# Patient Record
Sex: Female | Born: 1965 | Hispanic: No | Smoking: Never smoker
Health system: Southern US, Community
[De-identification: ages and names within clinical notes are randomized; demographics above are authoritative.]

## PROBLEM LIST (undated history)

## (undated) DIAGNOSIS — K589 Irritable bowel syndrome without diarrhea: Secondary | ICD-10-CM

## (undated) DIAGNOSIS — I10 Essential (primary) hypertension: Secondary | ICD-10-CM

## (undated) DIAGNOSIS — B009 Herpesviral infection, unspecified: Secondary | ICD-10-CM

## (undated) DIAGNOSIS — R51 Headache: Secondary | ICD-10-CM

## (undated) DIAGNOSIS — J45909 Unspecified asthma, uncomplicated: Secondary | ICD-10-CM

## (undated) DIAGNOSIS — J189 Pneumonia, unspecified organism: Secondary | ICD-10-CM

## (undated) DIAGNOSIS — F329 Major depressive disorder, single episode, unspecified: Secondary | ICD-10-CM

## (undated) DIAGNOSIS — T7840XA Allergy, unspecified, initial encounter: Secondary | ICD-10-CM

## (undated) DIAGNOSIS — R519 Headache, unspecified: Secondary | ICD-10-CM

## (undated) DIAGNOSIS — F32A Depression, unspecified: Secondary | ICD-10-CM

## (undated) DIAGNOSIS — K219 Gastro-esophageal reflux disease without esophagitis: Secondary | ICD-10-CM

## (undated) DIAGNOSIS — D649 Anemia, unspecified: Secondary | ICD-10-CM

## (undated) HISTORY — DX: Major depressive disorder, single episode, unspecified: F32.9

## (undated) HISTORY — PX: COLONOSCOPY: SHX174

## (undated) HISTORY — PX: WISDOM TOOTH EXTRACTION: SHX21

## (undated) HISTORY — DX: Essential (primary) hypertension: I10

## (undated) HISTORY — PX: EYE SURGERY: SHX253

## (undated) HISTORY — DX: Allergy, unspecified, initial encounter: T78.40XA

## (undated) HISTORY — DX: Depression, unspecified: F32.A

## (undated) HISTORY — PX: UPPER GI ENDOSCOPY: SHX6162

## (undated) HISTORY — PX: TUBAL LIGATION: SHX77

---

## 2013-03-10 ENCOUNTER — Ambulatory Visit (INDEPENDENT_AMBULATORY_CARE_PROVIDER_SITE_OTHER): Payer: BC Managed Care – PPO | Admitting: Internal Medicine

## 2013-03-10 ENCOUNTER — Ambulatory Visit: Payer: BC Managed Care – PPO

## 2013-03-10 VITALS — BP 140/88 | HR 93 | Temp 98.1°F | Resp 16 | Ht 63.5 in | Wt 157.0 lb

## 2013-03-10 DIAGNOSIS — M7650 Patellar tendinitis, unspecified knee: Secondary | ICD-10-CM

## 2013-03-10 DIAGNOSIS — M25561 Pain in right knee: Secondary | ICD-10-CM

## 2013-03-10 DIAGNOSIS — M25569 Pain in unspecified knee: Secondary | ICD-10-CM

## 2013-03-10 DIAGNOSIS — M7651 Patellar tendinitis, right knee: Secondary | ICD-10-CM

## 2013-03-10 MED ORDER — MELOXICAM 7.5 MG PO TABS: 7.5000 mg | ORAL_TABLET | Freq: Two times a day (BID) | ORAL | Status: DC | PRN

## 2013-03-10 NOTE — Progress Notes (Signed)
Patient ID: Mariah Nelson MRN: [id number], DOB: [birth year], 47 y.o. Date of Encounter: 03/10/2013, 9:15 PM  Primary Physician: No primary provider on file.  Chief Complaint: Right knee pain  HPI: 47 y.o. female with history below presents with right knee pain. Patient was moving from a 3rd floor home to a 3rd floor home on 03/08/13. During this move she did hire movers, but she did a lot of the lifting and moving herself. After this she began to notice the knee began to swell and become sore. Today the knee started to pop. It is sore along the bilateral sides. It is sore for her to go from a seated position to a standing position, but once she is standing she feels like the knee is "okay." She does feel like the knee is weaker and may "give out" on her a times. No pain proximal. Occasional pain distal, to her ankle, no known injury or trauma.    Past Medical History  Diagnosis Date  . Allergy   . Depression   . Hypertension      Home Meds: Prior to Admission medications   Medication Sig Start Date End Date Taking? Authorizing Provider  citalopram (CELEXA) 10 MG tablet Take 10 mg by mouth daily.   Yes Historical Provider, MD  lisinopril-hydrochlorothiazide (PRINZIDE,ZESTORETIC) 10-12.5 MG per tablet Take 1 tablet by mouth daily.   Yes Historical Provider, MD    Allergies:  Allergies  Allergen Reactions  . Sulfur Rash    History   Social History  . Marital Status: Divorced    Spouse Name: N/A    Number of Children: N/A  . Years of Education: N/A   Occupational History  . Social Worker    Social History Main Topics  . Smoking status: Never Smoker   . Smokeless tobacco: Not on file  . Alcohol Use: Yes     Comment: 1/month  . Drug Use: No  . Sexually Active: Not on file   Other Topics Concern  . Not on file   Social History Narrative  . No narrative on file     Review of Systems: Constitutional: negative for chills, fever, or fatigue  Musculoskeletal: see  above  Physical Exam: Blood pressure 140/88, pulse 93, temperature 98.1 F (36.7 C), temperature source Oral, resp. rate 16, height 5' 3.5" (1.613 m), weight 157 lb (71.215 kg), last menstrual period 03/02/2013, SpO2 98.00%., Body mass index is 27.37 kg/(m^2). General: Well developed, well nourished, in no acute distress. Head: Normocephalic, atraumatic, eyes without discharge, sclera non-icteric, nares are without discharge.  Neck: Supple. Full ROM. Lungs: Breathing is unlabored. Heart: Regular rate. Msk:  Strength and tone normal for age. Extremities/Skin: Right knee with mild effusion. No erythema or ecchymosis. Mild TTP along medial and lateral joint lines. No lateral deviation of the patella. No TTP of the patella. FROM. 5/5 strength. Negative varus and valgus stress. Negative Anterior drawer. Negative Lachman's. Negative McMurray's. Distal pulses intact. Left knee unremarkable.  Neuro: Alert and oriented X 3. Moves all extremities spontaneously. Gait is normal. CNII-XII grossly in tact. Psych:  Responds to questions appropriately with a normal affect.   Right knee: [facility] reading (PRIMARY) by  Dr. Merla Riches. No acute fracture.  ASSESSMENT AND PLAN:  47 y.o. female with patellar tendonitis and knee pain -Hinged knee brace -Mobic 7.5 mg 1 po bid prn #60 no RF -Rest -Ice -Recheck if no better   Signed, Eula Listen, PA-C 03/10/2013 9:15 PM  I have reviewed and agree with  documentation. Robert P. Merla Riches, M.D.

## 2014-06-18 ENCOUNTER — Emergency Department (HOSPITAL_COMMUNITY): Payer: 59

## 2014-06-18 ENCOUNTER — Emergency Department (HOSPITAL_COMMUNITY)
Admission: EM | Admit: 2014-06-18 | Discharge: 2014-06-18 | Disposition: A | Discharge status: Home / Self Care | Payer: 59 | Attending: Emergency Medicine | Admitting: Emergency Medicine

## 2014-06-18 ENCOUNTER — Encounter (HOSPITAL_COMMUNITY): Payer: Self-pay | Admitting: Emergency Medicine

## 2014-06-18 DIAGNOSIS — J040 Acute laryngitis: Secondary | ICD-10-CM | POA: Insufficient documentation

## 2014-06-18 DIAGNOSIS — R000 Tachycardia, unspecified: Secondary | ICD-10-CM | POA: Insufficient documentation

## 2014-06-18 DIAGNOSIS — J159 Unspecified bacterial pneumonia: Secondary | ICD-10-CM | POA: Insufficient documentation

## 2014-06-18 DIAGNOSIS — J189 Pneumonia, unspecified organism: Secondary | ICD-10-CM

## 2014-06-18 DIAGNOSIS — I10 Essential (primary) hypertension: Secondary | ICD-10-CM | POA: Insufficient documentation

## 2014-06-18 DIAGNOSIS — Z8659 Personal history of other mental and behavioral disorders: Secondary | ICD-10-CM | POA: Insufficient documentation

## 2014-06-18 DIAGNOSIS — Z79899 Other long term (current) drug therapy: Secondary | ICD-10-CM | POA: Insufficient documentation

## 2014-06-18 LAB — CBC WITH DIFFERENTIAL/PLATELET
Basophils Absolute: 0 10*3/uL (ref 0.0–0.1)
Basophils Relative: 0 % (ref 0–1)
Eosinophils Absolute: 0 10*3/uL (ref 0.0–0.7)
Eosinophils Relative: 0 % (ref 0–5)
HCT: 38.4 % (ref 36.0–46.0)
Hemoglobin: 13.2 g/dL (ref 12.0–15.0)
Lymphocytes Relative: 12 % (ref 12–46)
Lymphs Abs: 1.3 10*3/uL (ref 0.7–4.0)
MCH: 31.2 pg (ref 26.0–34.0)
MCHC: 34.4 g/dL (ref 30.0–36.0)
MCV: 90.8 fL (ref 78.0–100.0)
Monocytes Absolute: 0.1 10*3/uL (ref 0.1–1.0)
Monocytes Relative: 1 % — ABNORMAL LOW (ref 3–12)
Neutro Abs: 9.4 10*3/uL — ABNORMAL HIGH (ref 1.7–7.7)
Neutrophils Relative %: 87 % — ABNORMAL HIGH (ref 43–77)
Platelets: 271 10*3/uL (ref 150–400)
RBC: 4.23 MIL/uL (ref 3.87–5.11)
RDW: 13.2 % (ref 11.5–15.5)
WBC: 10.9 10*3/uL — ABNORMAL HIGH (ref 4.0–10.5)

## 2014-06-18 LAB — BASIC METABOLIC PANEL
Anion gap: 20 — ABNORMAL HIGH (ref 5–15)
BUN: 7 mg/dL (ref 6–23)
CO2: 20 mEq/L (ref 19–32)
Calcium: 9.2 mg/dL (ref 8.4–10.5)
Chloride: 98 mEq/L (ref 96–112)
Creatinine, Ser: 0.76 mg/dL (ref 0.50–1.10)
GFR calc Af Amer: >90 mL/min (ref >90)
GFR calc non Af Amer: >90 mL/min (ref >90)
Glucose, Bld: 171 mg/dL — ABNORMAL HIGH (ref 70–99)
Potassium: 3.4 mEq/L — ABNORMAL LOW (ref 3.7–5.3)
Sodium: 138 mEq/L (ref 137–147)

## 2014-06-18 MED ORDER — GUAIFENESIN-CODEINE 100-10 MG/5ML PO SOLN
10.0000 mL | Freq: Once | ORAL | Status: AC
  Administered 2014-06-18: 10 mL via ORAL
  Filled 2014-06-18: qty 10

## 2014-06-18 MED ORDER — MORPHINE SULFATE 4 MG/ML IJ SOLN
4.0000 mg | Freq: Once | INTRAMUSCULAR | Status: AC
  Administered 2014-06-18: 4 mg via INTRAVENOUS
  Filled 2014-06-18: qty 1

## 2014-06-18 MED ORDER — PREDNISONE 20 MG PO TABS: 40.0000 mg | ORAL_TABLET | Freq: Every day | ORAL | Status: DC

## 2014-06-18 MED ORDER — DEXTROSE 5 % IV SOLN
1.0000 g | Freq: Once | INTRAVENOUS | Status: AC
  Administered 2014-06-18: 1 g via INTRAVENOUS
  Filled 2014-06-18: qty 10

## 2014-06-18 MED ORDER — SODIUM CHLORIDE 0.9 % IV BOLUS (SEPSIS)
1000.0000 mL | Freq: Once | INTRAVENOUS | Status: AC
  Administered 2014-06-18: 1000 mL via INTRAVENOUS

## 2014-06-18 MED ORDER — IOHEXOL 350 MG/ML SOLN
80.0000 mL | Freq: Once | INTRAVENOUS | Status: AC | PRN
  Administered 2014-06-18: 80 mL via INTRAVENOUS

## 2014-06-18 MED ORDER — KETOROLAC TROMETHAMINE 15 MG/ML IJ SOLN
15.0000 mg | Freq: Once | INTRAMUSCULAR | Status: AC
  Administered 2014-06-18: 15 mg via INTRAVENOUS
  Filled 2014-06-18: qty 1

## 2014-06-18 MED ORDER — DEXTROSE 5 % IV SOLN
500.0000 mg | Freq: Once | INTRAVENOUS | Status: AC
  Administered 2014-06-18: 500 mg via INTRAVENOUS
  Filled 2014-06-18: qty 500

## 2014-06-18 MED ORDER — AZITHROMYCIN 250 MG PO TABS: 250.0000 mg | ORAL_TABLET | Freq: Every day | ORAL | Status: DC

## 2014-06-18 MED ORDER — ONDANSETRON HCL 4 MG/2ML IJ SOLN
4.0000 mg | Freq: Once | INTRAMUSCULAR | Status: AC
  Administered 2014-06-18: 4 mg via INTRAVENOUS
  Filled 2014-06-18: qty 2

## 2014-06-18 NOTE — ED Notes (Signed)
Patient ambulated well and without assistance. Pulse Ox level prior to ambulation 97-98% , HR 117. Pulse Oximetry during and at completion of ambulation was 96-97% , HR 118.

## 2014-06-18 NOTE — Discharge Instructions (Signed)
Laryngitis At the top of your windpipe is your voice box. It is the source of your voice. Inside your voice box are 2 bands of muscles called vocal cords. When you breathe, your vocal cords are relaxed and open so that air can get into the lungs. When you decide to say something, these cords come together and vibrate. The sound from these vibrations goes into your throat and comes out through your mouth as sound. Laryngitis is an inflammation of the vocal cords that causes hoarseness, cough, loss of voice, sore throat, and dry throat. Laryngitis can be temporary (acute) or long-term (chronic). Most cases of acute laryngitis improve with time.Chronic laryngitis lasts for more than 3 weeks. CAUSES Laryngitis can often be related to excessive smoking, talking, or yelling, as well as inhalation of toxic fumes and allergies. Acute laryngitis is usually caused by a viral infection, vocal strain, measles or mumps, or bacterial infections. Chronic laryngitis is usually caused by vocal cord strain, vocal cord injury, postnasal drip, growths on the vocal cords, or acid reflux. SYMPTOMS   Cough.  Sore throat.  Dry throat. RISK FACTORS  Respiratory infections.  Exposure to irritating substances, such as cigarette smoke, excessive amounts of alcohol, stomach acids, and workplace chemicals.  Voice trauma, such as vocal cord injury from shouting or speaking too loud. DIAGNOSIS  Your cargiver will perform a physical exam. During the physical exam, your caregiver will examine your throat. The most common sign of laryngitis is hoarseness. Laryngoscopy may be necessary to confirm the diagnosis of this condition. This procedure allows your caregiver to look into the larynx. HOME CARE INSTRUCTIONS  Drink enough fluids to keep your urine clear or pale yellow.  Rest until you no longer have symptoms or as directed by your caregiver.  Breathe in moist air.  Take all medicine as directed by your  caregiver.  Do not smoke.  Talk as little as possible (this includes whispering).  Write on paper instead of talking until your voice is back to normal.  Follow up with your caregiver if your condition has not improved after 10 days. SEEK MEDICAL CARE IF:   You have trouble breathing.  You cough up blood.  You have persistent fever.  You have increasing pain.  You have difficulty swallowing. MAKE SURE YOU:  Understand these instructions.  Will watch your condition.  Will get help right away if you are not doing well or get worse. Document Released: 12/04/2005 Document Revised: 02/26/2012 Document Reviewed: 02/09/2011 [facility] Patient Information 2015 [place], [place]. This information is not intended to replace advice given to you by your health care provider. Make sure you discuss any questions you have with your health care provider.  Pneumonia, Adult Pneumonia is an infection of the lungs. It may be caused by a germ (virus or bacteria). Some types of pneumonia can spread easily from person to person. This can happen when you cough or sneeze. HOME CARE  Only take medicine as told by your doctor.  Take your medicine (antibiotics) as told. Finish it even if you start to feel better.  Do not smoke.  You may use a vaporizer or humidifier in your room. This can help loosen thick spit (mucus).  Sleep so you are almost sitting up (semi-upright). This helps reduce coughing.  Rest. A shot (vaccine) can help prevent pneumonia. Shots are often advised for:  People over 3 years old.  Patients on chemotherapy.  People with long-term (chronic) lung problems.  People with immune system problems.  GET HELP RIGHT AWAY IF:   You are getting worse.  You cannot control your cough, and you are losing sleep.  You cough up blood.  Your pain gets worse, even with medicine.  You have a fever.  Any of your problems are getting worse, not better.  You have shortness of  breath or chest pain. MAKE SURE YOU:   Understand these instructions.  Will watch your condition.  Will get help right away if you are not doing well or get worse. Document Released: 05/22/2008 Document Revised: 02/26/2012 Document Reviewed: 02/24/2011 [facility] Patient Information 2015 [place], [place]. This information is not intended to replace advice given to you by your health care provider. Make sure you discuss any questions you have with your health care provider.

## 2014-06-18 NOTE — ED Notes (Signed)
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 Pt started to have some hoarseness and throat tightness last night. Today while at work pt had sudden onset of SOB diaphoresis. EMS arrived on scene and pt was lying on floor diaphoretic, yellow in color and obvious respiratory distress. O2sat upon arrival 86%ra. MD at office administered 0.3 of Epi, EMS administered 50 of Benadryl, 50 of Zantac, 125 Solumedrol and 4 of Zofran. EMS also administered Saline neb tx which helped some and o2sats increased to 100%. HR-150 BP-168/110 There is some swelling to airway that you can palpate, pt is able to swallow and c/o sore throat. Denies any cp.

## 2014-06-18 NOTE — ED Provider Notes (Signed)
CSN: [id number]     Arrival date & time 06/18/14  1011 History   First MD Initiated Contact with Patient 06/18/14 1017     Chief Complaint  Patient presents with  . Respiratory Distress     (Consider location/radiation/quality/duration/timing/severity/associated sxs/prior Treatment) HPI  47yF with sob. Last night began having sore throat/tightness. Acutely worse shortly before arrival. Diaphoresis. No CP. +SOB. Given epi, steroids, benadryl/pepcid prior to arrival. Still feels SOB. No new exposures that she is aware of. No drooling. No rash/itching. Mild nausea. No vomiting or diarrhea. No abdominal pain/cramping. No fever. No hx of similar symptoms.   Past Medical History  Diagnosis Date  . Allergy   . Depression   . Hypertension    Past Surgical History  Procedure Laterality Date  . Eye surgery    . Tubal ligation     Family History  Problem Relation Age of Onset  . Diabetes Mother   . Hypertension Mother   . Heart disease Father   . Diabetes Father   . Alcohol abuse Father   . Diabetes Sister   . Hypertension Sister    History  Substance Use Topics  . Smoking status: Never Smoker   . Smokeless tobacco: Not on file  . Alcohol Use: Yes     Comment: 1/month   OB History   Grav Para Term Preterm Abortions TAB SAB Ect Mult Living                 Review of Systems  All systems reviewed and negative, other than as noted in HPI.   Allergies  Sulfur  Home Medications   Prior to Admission medications   Medication Sig Start Date End Date Taking? Authorizing Provider  Cholecalciferol (VITAMIN D) 2000 UNITS CAPS Take 2,000 Units by mouth daily.   Yes Historical Provider, MD  ECHINACEA-ZINC-VITAMIN C PO Take 1 tablet by mouth daily as needed (for cold symptoms).   Yes Historical Provider, MD  lisinopril-hydrochlorothiazide (PRINZIDE,ZESTORETIC) 10-12.5 MG per tablet Take 1 tablet by mouth daily.   Yes Historical Provider, MD  loratadine (CLARITIN) 10 MG tablet Take  10 mg by mouth daily.   Yes Historical Provider, MD   BP 138/93  Pulse 137  Temp(Src) 99 F (37.2 C) (Oral)  Resp 30  SpO2 100% Physical Exam  Nursing note and vitals reviewed. Constitutional: She appears well-developed and well-nourished.  Sitting up in bed. Appears uncomfortable.   HENT:  Head: Normocephalic and atraumatic.  Mouth/Throat: Oropharynx is clear and moist.  Muffled sounding voice. Anterior neck seems full but no intraoral swelling appreciated. Posterior pharynx clear. Handling secretions. Neck supple. No adenopathy. Submental tissues soft.   Eyes: Conjunctivae are normal. Right eye exhibits no discharge. Left eye exhibits no discharge.  Neck: Neck supple.  Cardiovascular: Regular rhythm and normal heart sounds.  Exam reveals no gallop and no friction rub.   No murmur heard. tachycardic  Pulmonary/Chest: Breath sounds normal.  Tachypnea. lungs clear.   Abdominal: Soft. She exhibits no distension. There is no tenderness.  Musculoskeletal: She exhibits no edema and no tenderness.  Lower extremities symmetric as compared to each other. No calf tenderness. Negative Homan's. No palpable cords.   Neurological: She is alert.  Skin: Skin is warm and dry.  Psychiatric: Her behavior is normal. Thought content normal.  Appears anxious    ED Course  Procedures (including critical care time) Labs Review Labs Reviewed  CBC WITH DIFFERENTIAL - Abnormal; Notable for the following:    WBC 10.9 (*)  Neutrophils Relative % 87 (*)    Neutro Abs 9.4 (*)    Monocytes Relative 1 (*)    All other components within normal limits  BASIC METABOLIC PANEL    Imaging Review No results found.  Dg Neck Soft Tissue  06/18/2014   CLINICAL DATA:  RESPIRATORY DISTRESS  EXAM: NECK SOFT TISSUES - 1+ VIEW  COMPARISON:  None.  FINDINGS: There is no evidence of retropharyngeal soft tissue swelling or epiglottic enlargement. The cervical airway is unremarkable and no radio-opaque foreign body  identified.  IMPRESSION: Negative.   Electronically Signed   By: Margaree Mackintosh M.D.   On: 06/18/2014 11:59   Dg Chest 2 View  06/18/2014   CLINICAL DATA:  RESPIRATORY DISTRESS  EXAM: CHEST  2 VIEW  COMPARISON:  None.  FINDINGS: The heart size and mediastinal contours are within normal limits. Both lungs are clear. The visualized skeletal structures are unremarkable.  IMPRESSION: No active cardiopulmonary disease.   Electronically Signed   By: Margaree Mackintosh M.D.   On: 06/18/2014 11:58   Ct Soft Tissue Neck W Contrast  06/18/2014   CLINICAL DATA:  RESPIRATORY DISTRESS  EXAM: CT NECK WITH CONTRAST  TECHNIQUE: Multidetector CT imaging of the neck was performed using the standard protocol following the bolus administration of intravenous contrast.  CONTRAST:  51mL OMNIPAQUE IOHEXOL 350 MG/ML SOLN  COMPARISON:  None.  FINDINGS: Skull base a negative. Visualized portions of the orbits are symmetric and negative.  The spaces of the neck are maintained.  The pharyngeal, laryngeal, and glottic regions are unremarkable.  No neck masses, free fluid, nor loculated fluid collections. Airway is patent.  The submandibular and parotid glands are symmetric without enhancement or attenuation abnormalities.  The vascular structures are unremarkable. No pathologic sized adenopathy within the neck.  The osseous structures.  The lung apices are unremarkable.  IMPRESSION: Unremarkable neck CT.   Electronically Signed   By: Margaree Mackintosh M.D.   On: 06/18/2014 13:44   Ct Angio Chest W/cm &/or Wo Cm  06/18/2014   CLINICAL DATA:  Respiratory distress  EXAM: CT ANGIOGRAPHY CHEST WITH CONTRAST  TECHNIQUE: Multidetector CT imaging of the chest was performed using the standard protocol during bolus administration of intravenous contrast. Multiplanar CT image reconstructions and MIPs were obtained to evaluate the vascular anatomy.  CONTRAST:  81mL OMNIPAQUE IOHEXOL 350 MG/ML SOLN  COMPARISON:  None.  FINDINGS: There are no filling defects  in the pulmonary arterial tree to suggest acute pulmonary thromboembolism.  No abnormal mediastinal adenopathy.  Discoid opacity in the right middle lobe measuring 6 x 9 x 2 mm is likely atelectasis in the right middle lobe on image 44.  Low lung volumes with hypoaeration changes and deep and an lungs.  No pneumothorax or pleural effusion.  No acute bony deformity.  Review of the MIP images confirms the above findings.  IMPRESSION: No evidence of acute pulmonary thromboembolism.  6 x 9 x 2 mm opacity in the right middle lobe is nonspecific but likely linear atelectasis. Initial follow-up by chest CT without contrast is recommended in 3 months to confirm persistence. This recommendation follows the consensus statement: Recommendations for the Management of Subsolid Pulmonary Nodules Detected at CT: A Statement from the [employer] as published in Radiology 2013; 266:304-317.   Electronically Signed   By: Maryclare Bean M.D.   On: 06/18/2014 13:54    EKG Interpretation   Date/Time:  Thursday June 18 2014 10:19:58 EDT Ventricular Rate:  114 PR Interval:  131 QRS Duration: 78 QT Interval:  372 QTC Calculation: 512 R Axis:   28 Text Interpretation:  Age not entered, assumed to be  48 years old for  purpose of ECG interpretation Sinus tachycardia with irregular rate  Borderline repolarization abnormality Prolonged QT interval ED PHYSICIAN  INTERPRETATION AVAILABLE IN [place] [place] Confirmed by TEST, Record  ([id number]) on 06/20/2014 2:23:44 PM      MDM   Final diagnoses:  Laryngitis  CAP (community acquired pneumonia)    52yF with likely laryngitis. Imaging w/o evidence of deep space neck infection. RML opacity. Not convinced that pneumonia but with symptoms, will tx as such. Triage O2 sats low. Unsure of significance. Appears comfortable on DC. Ambulated w/o assistance on RA and O2 sats 97-98%. HR now in 90-100. Initial HR likely from epinephrine PTA. She may have had allergic reaction to  something? Symptoms stabilized w/o further epi given. CT w/o evidence of PE. I feel stable for discharge and outpt tx.     Virgel Manifold, MD 06/21/14 [phone]

## 2014-12-23 ENCOUNTER — Ambulatory Visit (INDEPENDENT_AMBULATORY_CARE_PROVIDER_SITE_OTHER): Payer: 59 | Admitting: Physician Assistant

## 2014-12-23 ENCOUNTER — Encounter: Payer: Self-pay | Admitting: Physician Assistant

## 2014-12-23 VITALS — BP 128/80 | HR 102 | Temp 98.0°F | Resp 17 | Ht 64.0 in | Wt 165.0 lb

## 2014-12-23 DIAGNOSIS — I10 Essential (primary) hypertension: Secondary | ICD-10-CM | POA: Insufficient documentation

## 2014-12-23 DIAGNOSIS — J0110 Acute frontal sinusitis, unspecified: Secondary | ICD-10-CM

## 2014-12-23 MED ORDER — AMOXICILLIN-POT CLAVULANATE 875-125 MG PO TABS: 1.0000 | ORAL_TABLET | Freq: Two times a day (BID) | ORAL | Status: DC

## 2014-12-23 MED ORDER — IPRATROPIUM BROMIDE 0.03 % NA SOLN: 2.0000 | Freq: Two times a day (BID) | NASAL | Status: DC

## 2014-12-23 NOTE — Progress Notes (Signed)
Subjective:    Patient ID: Mariah Nelson, female    DOB: [birth year], 49 y.o.   MRN: [id number]  HPI Patient presents for sinus infection that has been present for 2 weeks. Has also noticed left lymph node swelling over the past few days. Feels T-zone sinus pressure and pressure behind the eyes. Endorses congestion, rhinorrhea, mild sore throat, and headache. Has had intermittent mild cough. Decided to come in today due to new sx of dizziness. Denies fever, ear pressure, SOB/CP, or difficulty swallowing. 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 Has tried Mucinex, other OTC decongestants, and Advil with some relief. Has seasonal allergies that are controlled, but no h/o asthma and has never smoked. Med allergy/intolerance to sulfa drugs and tessalon.    Review of Systems  Constitutional: Positive for fatigue. Negative for fever, chills, diaphoresis, activity change and appetite change.  HENT: Positive for congestion, postnasal drip, rhinorrhea, sinus pressure and sore throat. Negative for ear discharge, ear pain, nosebleeds, sneezing and trouble swallowing.   Eyes: Positive for pain (pressure). Negative for discharge and visual disturbance.  Respiratory: Positive for cough. Negative for chest tightness, shortness of breath and wheezing.   Cardiovascular: Negative for chest pain.  Gastrointestinal: Negative for nausea, vomiting and abdominal pain.  Musculoskeletal: Positive for neck pain (left sided). Negative for neck stiffness.  Allergic/Immunologic: Positive for environmental allergies. Negative for food allergies.  Neurological: Positive for headaches. Negative for dizziness and light-headedness.       Objective:   Physical Exam  Constitutional: She is oriented to person, place, and time. She appears well-developed and well-nourished. No distress.  Blood pressure 128/80, pulse 102, temperature 98 F (36.7 C), temperature source Oral, resp. rate  17, height 5\' 4"  (1.626 m), weight 165 lb (74.844 kg), SpO2 96 %.  HENT:  Head: Normocephalic and atraumatic.  Right Ear: Tympanic membrane, external ear and ear canal normal.  Left Ear: Tympanic membrane, external ear and ear canal normal.  Nose: Mucosal edema and rhinorrhea (with erythema) present. Right sinus exhibits frontal sinus tenderness. Right sinus exhibits no maxillary sinus tenderness. Left sinus exhibits frontal sinus tenderness. Left sinus exhibits no maxillary sinus tenderness.  Mouth/Throat: Uvula is midline, oropharynx is clear and moist and mucous membranes are normal. No oropharyngeal exudate.  Eyes: Conjunctivae are normal. Pupils are equal, round, and reactive to light. Right eye exhibits no discharge. Left eye exhibits no discharge. No scleral icterus.  Neck: Normal range of motion. No thyromegaly present.  Cardiovascular: Normal rate, regular rhythm and normal heart sounds.  Exam reveals no gallop and no friction rub.   No murmur heard. Pulmonary/Chest: Effort normal and breath sounds normal. No respiratory distress. She has no decreased breath sounds. She has no wheezes. She has no rhonchi. She has no rales. She exhibits no tenderness.  Abdominal: Soft. Bowel sounds are normal. She exhibits no distension. There is no tenderness.  Lymphadenopathy:    She has cervical adenopathy.       Right cervical: Superficial cervical adenopathy present.       Left cervical: Superficial cervical adenopathy present.  Neurological: She is alert and oriented to person, place, and time.  Skin: Skin is warm and dry. No rash noted. She is not diaphoretic. No erythema. No pallor.       Assessment & Plan:  1. Acute frontal sinusitis, recurrence not specified Plenty of fluid and water.  - ipratropium (ATROVENT) 0.03 % nasal spray; Place 2 sprays into both nostrils  2 (two) times daily.  Dispense: 30 mL; Refill: 0 - amoxicillin-clavulanate (AUGMENTIN) 875-125 MG per tablet; Take 1 tablet by  mouth 2 (two) times daily.  Dispense: 20 tablet; Refill: 0   Jacek Colson PA-C  [facility] 12/23/2014 3:06 PM

## 2014-12-23 NOTE — Patient Instructions (Signed)

## 2014-12-24 ENCOUNTER — Other Ambulatory Visit: Payer: Self-pay

## 2014-12-24 MED ORDER — FLUCONAZOLE 150 MG PO TABS: 150.0000 mg | ORAL_TABLET | Freq: Once | ORAL | Status: DC

## 2014-12-24 NOTE — Telephone Encounter (Signed)
Pt given abx yesterday. Pended Diflucan.

## 2014-12-24 NOTE — Telephone Encounter (Signed)
Pt called. States she needs antibiotic for a yeast infection. [facility] on [address] # [phone]

## 2015-03-02 ENCOUNTER — Encounter: Payer: Self-pay | Admitting: Obstetrics & Gynecology

## 2015-03-02 ENCOUNTER — Ambulatory Visit (INDEPENDENT_AMBULATORY_CARE_PROVIDER_SITE_OTHER): Payer: 59 | Admitting: Obstetrics & Gynecology

## 2015-03-02 VITALS — BP 147/99 | HR 91 | Ht 64.0 in | Wt 161.6 lb

## 2015-03-02 DIAGNOSIS — N920 Excessive and frequent menstruation with regular cycle: Secondary | ICD-10-CM

## 2015-03-02 DIAGNOSIS — Z0000 Encounter for general adult medical examination without abnormal findings: Secondary | ICD-10-CM

## 2015-03-02 DIAGNOSIS — D259 Leiomyoma of uterus, unspecified: Secondary | ICD-10-CM

## 2015-03-02 LAB — CBC
HEMATOCRIT: 34.1 % — AB (ref 36.0–46.0)
Hemoglobin: 10.6 g/dL — ABNORMAL LOW (ref 12.0–15.0)
MCH: 29.5 pg (ref 26.0–34.0)
MCHC: 31.1 g/dL (ref 30.0–36.0)
MCV: 95 fL (ref 78.0–100.0)
MPV: 10.4 fL (ref 8.6–12.4)
Platelets: 423 10*3/uL — ABNORMAL HIGH (ref 150–400)
RBC: 3.59 MIL/uL — ABNORMAL LOW (ref 3.87–5.11)
RDW: 14.4 % (ref 11.5–15.5)
WBC: 3.9 10*3/uL — ABNORMAL LOW (ref 4.0–10.5)

## 2015-03-02 LAB — TSH: TSH: 2.75 u[IU]/mL (ref 0.350–4.500)

## 2015-03-02 NOTE — Progress Notes (Signed)
   Subjective:    Patient ID: Mariah Nelson, female    DOB: 1966-05-05, 49 y.o.   MRN: [id number]  HPI 49 yo S P1 (50 yo son in [place]) here today because of heavy periods with clots.Has to leave work ([employer])  sometimes due to the bleeding. Lasts for 7 days, painful with first few days of periods, IBU helps. She has been on norethindrone 10 mg per day for about a year (Dr. Everett Graff). No u/s done. She had a EMBX last year (negative) and was scheduled to have an ablation versus IUD versus hysterectomy.  She required a d&c as a teenager due to heavy bleeding. Her periods have always been heavy. She was told she has fibroids in the past.   Review of Systems All normal paps, most recent about a year ago.  She is not currently sexually active, but does not recall dyspareunia    Objective:   Physical Exam [place] pale AA lady Breathing and ambulating normally Normal external genitalia, vagina, and cervix Moderate amount of blood seen in vault Enlarged uterus with fibroids, a posterior tender fibroid also noted      Assessment & Plan:  Menorrhagia, fibroids- check TSH, CBC, and u/s

## 2015-03-02 NOTE — Progress Notes (Signed)
Patient is having abnormal bleeding for 5 weeks now.  This is a second opinion.  She did have a normal endometrial biopsy last year but an ultrasound has not been done.  The bleeding has been significant enough to go through her clothes and onto the floor.  She is requesting FMLA to help her because she has to leave work sometimes to change clothes.  She is not interested in ablation or the IUD.  She has done research and is not interested in these options.  She feels increased fatigue and change in her color.

## 2015-03-09 ENCOUNTER — Telehealth: Payer: Self-pay | Admitting: *Deleted

## 2015-03-09 NOTE — Telephone Encounter (Signed)
Patient called to get her ultrasound results, patient notified of results and she will call back to schedule a follow-up with doctor to discuss treatment options.

## 2015-03-18 ENCOUNTER — Ambulatory Visit (INDEPENDENT_AMBULATORY_CARE_PROVIDER_SITE_OTHER): Payer: 59 | Admitting: Obstetrics & Gynecology

## 2015-03-18 ENCOUNTER — Encounter: Payer: Self-pay | Admitting: Obstetrics & Gynecology

## 2015-03-18 VITALS — BP 140/86 | HR 91 | Resp 16 | Ht 64.0 in | Wt 159.0 lb

## 2015-03-18 DIAGNOSIS — D251 Intramural leiomyoma of uterus: Secondary | ICD-10-CM | POA: Diagnosis not present

## 2015-03-18 DIAGNOSIS — N920 Excessive and frequent menstruation with regular cycle: Secondary | ICD-10-CM | POA: Diagnosis not present

## 2015-03-18 DIAGNOSIS — D649 Anemia, unspecified: Secondary | ICD-10-CM | POA: Insufficient documentation

## 2015-03-18 DIAGNOSIS — D500 Iron deficiency anemia secondary to blood loss (chronic): Secondary | ICD-10-CM | POA: Diagnosis not present

## 2015-03-18 LAB — POCT URINE PREGNANCY: PREG TEST UR: NEGATIVE

## 2015-03-18 NOTE — Progress Notes (Signed)
Imaging Results [place] Endovaginal (Non-OB) - Final result (03/02/2015 1:00 PM) [place] Endovaginal (Non-OB) - Final result (03/02/2015 1:00 PM)  Narrative  EXAM: [place] ENDOVAGINAL (NON-OB)  DATE: 03/02/15 13:00:14  ACCESSION: [id number]  DICTATED: 03/02/15 15:38:32  INTERPRETATION LOCATION: [place]    CLINICAL INDICATION: 49 Year Old (F): ABNORMAL UTERTINE BLEEDING.    COMPARISON: None    TECHNIQUE:Ultrasound views of the pelvis were obtained endovaginally using gray scale and color Doppler imaging.    FINDINGS:  The uterus measured 12.3 x 8.5 x 10.4 cm in size and is very heterogenous secondary to multiple fibroids. The endometrium was not well visualized secondary to be multiple uterine fibroids.  The largest uterine fibroid is an anterior mid to fundal intramural fibroid 6.8 x 5.2 x 4.4 cm. Small several subserosal fibroids are also seen measuring up to 3.1 cm.     The ovaries were only seen transabdominally. The right ovary measured 3.8 x 2.0 x 1.5 cm and the left ovary measured 2.9 x 2.5 x 3.0 cm. Small cystic areas were seen within both ovaries compatible with follicles.Appropriate flow was difficult to obtain but is seen in both ovaries with color and spectral doppler.No abnormal pelvic fluid was seen.    IMPRESSION:  The uterus is enlarged by multiple intramural uterine fibroids.The largest fibroid is a 6.8 cm anterior intramural fibroid. MRI could be more helpful for further evaluation.    There fibroids also obscure visualization of the endometrium.

## 2015-03-18 NOTE — Addendum Note (Signed)
Addended by: Lin Landsman C on: 03/18/2015 11:28 AM   Modules accepted: Orders

## 2015-03-18 NOTE — Progress Notes (Signed)
   Subjective:    Patient ID: Mariah Nelson, female    DOB: 10-05-1966, 49 y.o.   MRN: [id number]  HPI  49 yo AA lady with menorrhagia is here to discuss her results/work up. Her hbg was 10.7, normal TSH. Her u/s showed multiple fibroids.  Review of Systems     Objective:   Physical Exam  I did a bimanual exam and that uterus would not be amenable to a vaginal hysterectomy      Assessment & Plan:  Fibroids, anemia, menorrhagia- She will  a referral to IR for possible [place].

## 2015-03-24 ENCOUNTER — Telehealth: Payer: Self-pay | Admitting: *Deleted

## 2015-03-24 DIAGNOSIS — D259 Leiomyoma of uterus, unspecified: Secondary | ICD-10-CM

## 2015-03-26 NOTE — Telephone Encounter (Signed)
Pt needed an order put in.

## 2015-03-30 ENCOUNTER — Ambulatory Visit
Admission: RE | Admit: 2015-03-30 | Discharge: 2015-03-30 | Disposition: A | Discharge status: Home / Self Care | Payer: 59 | Source: Ambulatory Visit | Attending: Obstetrics & Gynecology | Admitting: Obstetrics & Gynecology

## 2015-03-30 DIAGNOSIS — D259 Leiomyoma of uterus, unspecified: Secondary | ICD-10-CM | POA: Insufficient documentation

## 2015-03-30 NOTE — Consult Note (Signed)
Chief Complaint: Chief Complaint  Patient presents with  . Advice Only    Consult for [place] for symptomatic fibroids    Referring Physician(s): Dove,Myra C  History of Present Illness: Mariah Nelson is a 49 y.o. G4 P1 is a 60 female with a long history of symptomatic uterine fibroids. She reports she was diagnosed with fibroids many years ago. Over the last several years she has noticed her periods have become increasingly heavy. Her periods have always been irregular throughout her life. When her cycle comes, it tends to last for 7 days with very heavy bleeding including passage of clots the first 4 days. She often wears overnight pads and has to change them every hour. She does note intermittent interpeak. Bleeding. She has had prior endometrial biopsy in August 2014 which was benign.  She rates her symptoms as quite severe. Her uterine fibroid symptom severity score is 100 out of 100. Additionally, her symptoms significantly affect her quality of life. Her health-related quality of life score is 13 out of a possible 100 (lower numbers indicating lower quality of life).  She has had anemia in the past with a hemoglobin low of 10.7. She took over-the-counter iron which resolved. She has since stopped taking the iron due to constipation. Bulk related symptoms include urinary frequency, bloating and back pain. She's had no prior therapy for fibroids.  Of note, her last menstrual period was 01/24/2015. She has not had a cycle over the last 6 weeks.  She denies fever, chills, vaginal discharge, chest pain, shortness of breath or other systemic symptoms.  She does note night sweats which occur almost every night over the past several years.  Past Medical History  Diagnosis Date  . Allergy   . Depression   . Hypertension     Past Surgical History  Procedure Laterality Date  . Eye surgery    . Tubal ligation      Allergies: Clarinex; Latex; Tessalon; and Sulfa  antibiotics  Medications: Prior to Admission medications   Medication Sig Start Date End Date Taking? Authorizing Provider  amLODipine (NORVASC) 5 MG tablet  11/29/14  Yes Historical Provider, MD  hydrochlorothiazide (MICROZIDE) 12.5 MG capsule  03/17/15  Yes Historical Provider, MD  ibuprofen (ADVIL,MOTRIN) 200 MG tablet Take 200 mg by mouth every 6 (six) hours as needed for moderate pain (Takes 600 mg as needed for back pain/menstrual cramps).   Yes Historical Provider, MD  temazepam (RESTORIL) 30 MG capsule  03/17/15  Yes Historical Provider, MD     Family History  Problem Relation Age of Onset  . Diabetes Mother   . Hypertension Mother   . Heart disease Father   . Diabetes Father   . Alcohol abuse Father   . Diabetes Sister   . Hypertension Sister     History   Social History  . Marital Status: Divorced    Spouse Name: N/A  . Number of Children: N/A  . Years of Education: N/A   Occupational History  . Social Worker    Social History Main Topics  . Smoking status: Never Smoker   . Smokeless tobacco: Never Used  . Alcohol Use: 0.0 oz/week    0 Standard drinks or equivalent per week     Comment: 1/month  . Drug Use: No  . Sexual Activity: Not Currently   Other Topics Concern  . Not on file   Social History Narrative    Review of Systems: A 12 point ROS discussed and pertinent  positives are indicated in the HPI above.  All other systems are negative.  Review of Systems  Vital Signs: BP 142/96 mmHg  Pulse 113  Temp(Src) 98.3 F (36.8 C) (Oral)  Resp 15  Ht 5\' 4"  (1.626 m)  Wt 156 lb (70.761 kg)  BMI 26.76 kg/m2  SpO2 96%  LMP 01/24/2015  Physical Exam  Constitutional: She is oriented to person, place, and time. She appears well-developed and well-nourished.  HENT:  Head: Normocephalic and atraumatic.  Eyes: No scleral icterus.  Neck: No thyromegaly present.  Cardiovascular: Regular rhythm.   Pulmonary/Chest: Effort normal and breath sounds normal.  No respiratory distress.  Abdominal: Soft. She exhibits no distension and no mass. There is no tenderness.  Neurological: She is alert and oriented to person, place, and time.  Skin: Skin is warm and dry.  Psychiatric: She has a normal mood and affect. Her behavior is normal.  Vitals reviewed.   Imaging: No results found.  Labs:  CBC:  Recent Labs  06/18/14 1134 03/02/15 1135  WBC 10.9* 3.9*  HGB 13.2 10.6*  HCT 38.4 34.1*  PLT 271 423*    COAGS: No results for input(s): INR, APTT in the last 8760 hours.  BMP:  Recent Labs  06/18/14 1134  NA 138  K 3.4*  CL 98  CO2 20  GLUCOSE 171*  BUN 7  CALCIUM 9.2  CREATININE 0.76  GFRNONAA >90  GFRAA >90    LIVER FUNCTION TESTS: No results for input(s): BILITOT, AST, ALT, ALKPHOS, PROT, ALBUMIN in the last 8760 hours.  TUMOR MARKERS: No results for input(s): AFPTM, CEA, CA199, CHROMGRNA in the last 8760 hours.  Assessment and Plan:  49 year old female with symptomatic uterine fibroids. Her primary symptom is menometrorrhagia with some relatively mild bulk symptoms. Of note, she has not had a cycle for the past 6 weeks and she has been experiencing night sweats over the last several years. It is very possible given her age that she is perimenopausal.  We discussed the natural history of uterine fibroids as well as the possible therapeutic options including hormonal regulation, endometrial ablation, uterine artery embolization, and surgical hysterectomy. In more detail, we discussed the risks, benefits and alternatives to uterine artery embolization.  After answering all of her questions I encouraged her to consider a wait and see approach. As she has not had a cycle for 6 weeks and may be entering menopause, she may have very few cycles left to deal with before her symptoms resolve on their own. If, however her symptoms and cycle recur I would be more than happy to consider her for uterine artery embolization. The next  step would be an MRI of the pelvis with and without contrast to fully evaluate her uterine anatomy and assess for other concomitant issues such as adenomyosis.  She will call the office if and when she decides to pursue the MRI.  Thank you for this interesting consult.  I greatly enjoyed meeting Chrishawn Kring and look forward to participating in their care.  SignedJacqulynn Cadet 03/30/2015, 5:35 PM   I spent a total of  30 Minutes  in face to face in clinical consultation, greater than 50% of which was counseling/coordinating care for uterine fibroids

## 2015-04-13 ENCOUNTER — Encounter: Payer: Self-pay | Admitting: Obstetrics & Gynecology

## 2015-04-13 ENCOUNTER — Ambulatory Visit (INDEPENDENT_AMBULATORY_CARE_PROVIDER_SITE_OTHER): Payer: 59 | Admitting: Obstetrics & Gynecology

## 2015-04-13 VITALS — BP 143/94 | HR 108 | Wt 161.0 lb

## 2015-04-13 DIAGNOSIS — D251 Intramural leiomyoma of uterus: Secondary | ICD-10-CM

## 2015-04-13 MED ORDER — MEGESTROL ACETATE 40 MG PO TABS: 40.0000 mg | ORAL_TABLET | Freq: Two times a day (BID) | ORAL | Status: DC

## 2015-04-13 NOTE — Progress Notes (Signed)
   Subjective:    Patient ID: Mariah Nelson, female    DOB: 1966/12/17, 49 y.o.   MRN: [id number]  HPI  49 yo lady with menorrhagia, fibroids. She spoke with the IR MD but she prefers to have a hysterectomy. She has bled for the last 6 weeks but finally stopped. She can't have her surgery until Sept because she cannot sign up for short term disabiltiy until August. She still does not want to use a depo provera shot in the interim.  Review of Systems     Objective:   Physical Exam        Assessment & Plan:  I will send Mariah Nelson an email to schedule this.

## 2015-07-14 ENCOUNTER — Encounter (HOSPITAL_COMMUNITY): Payer: Self-pay | Admitting: *Deleted

## 2015-09-07 ENCOUNTER — Encounter (HOSPITAL_COMMUNITY): Admission: RE | Payer: Self-pay | Source: Ambulatory Visit

## 2015-09-07 ENCOUNTER — Inpatient Hospital Stay (HOSPITAL_COMMUNITY): Admission: RE | Admit: 2015-09-07 | Payer: 59 | Source: Ambulatory Visit | Admitting: Obstetrics & Gynecology

## 2015-09-07 SURGERY — HYSTERECTOMY, ABDOMINAL
Anesthesia: Choice | Site: Abdomen

## 2016-03-07 ENCOUNTER — Encounter: Payer: Self-pay | Admitting: Obstetrics & Gynecology

## 2016-03-07 ENCOUNTER — Ambulatory Visit (INDEPENDENT_AMBULATORY_CARE_PROVIDER_SITE_OTHER): Payer: Managed Care, Other (non HMO) | Admitting: Obstetrics & Gynecology

## 2016-03-07 VITALS — BP 188/130 | HR 114 | Resp 16 | Ht 64.0 in | Wt 150.0 lb

## 2016-03-07 DIAGNOSIS — Z862 Personal history of diseases of the blood and blood-forming organs and certain disorders involving the immune mechanism: Secondary | ICD-10-CM | POA: Diagnosis not present

## 2016-03-07 DIAGNOSIS — N898 Other specified noninflammatory disorders of vagina: Secondary | ICD-10-CM

## 2016-03-07 DIAGNOSIS — N9489 Other specified conditions associated with female genital organs and menstrual cycle: Secondary | ICD-10-CM

## 2016-03-07 MED ORDER — METRONIDAZOLE 500 MG PO TABS: 500.0000 mg | ORAL_TABLET | Freq: Two times a day (BID) | ORAL | Status: DC

## 2016-03-07 NOTE — Progress Notes (Signed)
   Subjective:    Patient ID: Mariah Nelson, female    DOB: September 30, 1966, 50 y.o.   MRN: [id number]  HPI  50 yo S AA lady with a long h/o BV here with an intense vaginal odor at times, sometimes this is even noticed by coworkers. Of note, her fibroid pain has improved. She has monthly regular periods, somewhat heavy  Review of Systems     Objective:   Physical Exam WNWHBFNAD Breathing, conversing, and ambulating normally Abd- benign Vagina- no odor or discharge today       Assessment & Plan:  H/o anemia- recheck CBC Recurrent BV- treat with flagyl (refills given). Recommend probiotics.  Wet prep sent

## 2016-03-08 LAB — CBC
HEMATOCRIT: 39.9 % (ref 36.0–46.0)
HEMOGLOBIN: 13.6 g/dL (ref 12.0–15.0)
MCH: 31.6 pg (ref 26.0–34.0)
MCHC: 34.1 g/dL (ref 30.0–36.0)
MCV: 92.8 fL (ref 78.0–100.0)
MPV: 11.1 fL (ref 8.6–12.4)
Platelets: 266 10*3/uL (ref 150–400)
RBC: 4.3 MIL/uL (ref 3.87–5.11)
RDW: 15.2 % (ref 11.5–15.5)
WBC: 4.2 10*3/uL (ref 4.0–10.5)

## 2016-03-08 LAB — WET PREP BY MOLECULAR PROBE
CANDIDA SPECIES: NEGATIVE
Gardnerella vaginalis: POSITIVE — AB
Trichomonas vaginosis: NEGATIVE

## 2016-04-17 ENCOUNTER — Telehealth: Payer: Self-pay | Admitting: *Deleted

## 2016-04-17 DIAGNOSIS — B3731 Acute candidiasis of vulva and vagina: Secondary | ICD-10-CM

## 2016-04-17 DIAGNOSIS — B373 Candidiasis of vulva and vagina: Secondary | ICD-10-CM

## 2016-04-17 MED ORDER — FLUCONAZOLE 150 MG PO TABS: ORAL_TABLET | ORAL | Status: DC

## 2016-04-17 NOTE — Telephone Encounter (Signed)
-----   Message from Francia Greaves sent at 04/17/2016  2:27 PM EDT ----- Regarding: Rx Request Contact: [phone] Called and left a message on office voice mail  States that she had a Rx for BV, and now has a yeast infection, has treated it OTC w/ Monistat but it is unresolved, wants to know if she can have something called in

## 2016-04-17 NOTE — Telephone Encounter (Signed)
Will send in Diflucan with 1 refill

## 2017-05-03 ENCOUNTER — Encounter: Payer: Self-pay | Admitting: Obstetrics & Gynecology

## 2017-05-03 ENCOUNTER — Ambulatory Visit (INDEPENDENT_AMBULATORY_CARE_PROVIDER_SITE_OTHER): Payer: BLUE CROSS/BLUE SHIELD | Admitting: Obstetrics & Gynecology

## 2017-05-03 VITALS — BP 164/90 | HR 86

## 2017-05-03 DIAGNOSIS — Z01419 Encounter for gynecological examination (general) (routine) without abnormal findings: Secondary | ICD-10-CM | POA: Diagnosis not present

## 2017-05-03 DIAGNOSIS — Z1151 Encounter for screening for human papillomavirus (HPV): Secondary | ICD-10-CM

## 2017-05-03 DIAGNOSIS — B373 Candidiasis of vulva and vagina: Secondary | ICD-10-CM

## 2017-05-03 DIAGNOSIS — Z113 Encounter for screening for infections with a predominantly sexual mode of transmission: Secondary | ICD-10-CM

## 2017-05-03 DIAGNOSIS — N938 Other specified abnormal uterine and vaginal bleeding: Secondary | ICD-10-CM

## 2017-05-03 DIAGNOSIS — Z124 Encounter for screening for malignant neoplasm of cervix: Secondary | ICD-10-CM

## 2017-05-03 DIAGNOSIS — B3731 Acute candidiasis of vulva and vagina: Secondary | ICD-10-CM

## 2017-05-03 MED ORDER — MISOPROSTOL 200 MCG PO TABS: ORAL_TABLET | ORAL | 0 refills | Status: AC

## 2017-05-03 MED ORDER — FLUCONAZOLE 150 MG PO TABS: ORAL_TABLET | ORAL | 6 refills | Status: AC

## 2017-05-03 MED ORDER — METRONIDAZOLE 500 MG PO TABS: 500.0000 mg | ORAL_TABLET | Freq: Two times a day (BID) | ORAL | 12 refills | Status: AC

## 2017-05-03 MED ORDER — ALPRAZOLAM 0.25 MG PO TABS: ORAL_TABLET | ORAL | 0 refills | Status: AC

## 2017-05-03 NOTE — Progress Notes (Signed)
Pt would like a year supply of Metronidazole and Diflucan due to recurrent BV after cycles.

## 2017-05-03 NOTE — Progress Notes (Signed)
Subjective:    Mariah Nelson is a 51 y.o. S P1 (36 yo son, no grands) female who presents for an annual exam. The patient has no complaints today. The patient is sexually active. GYN screening history: last pap: was normal. The patient wears seatbelts: yes. The patient participates in regular exercise: yes. Has the patient ever been transfused or tattooed?: no. The patient reports that there is not domestic violence in her life.   Menstrual History: OB History    Gravida Para Term Preterm AB Living   4 1 1  0 3 1   SAB TAB Ectopic Multiple Live Births   3 0 0 0 1      Menarche age: 16 Patient's last menstrual period was 04/12/2017.    The following portions of the patient's history were reviewed and updated as appropriate: allergies, current medications, past family history, past medical history, past social history, past surgical history and problem list.  Review of Systems Pertinent items are noted in HPI.   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/colon cancer Has fibroids, periods are 5-7 days, twice per month   Objective:    BP (!) 164/90   Pulse 86   LMP 04/12/2017   General Appearance:    Alert, cooperative, no distress, appears stated age  Head:    Normocephalic, without obvious abnormality, atraumatic  Eyes:    PERRL, conjunctiva/corneas clear, EOM's intact, fundi    benign, both eyes  Ears:    Normal TM's and external ear canals, both ears  Nose:   Nares normal, septum midline, mucosa normal, no drainage    or sinus tenderness  Throat:   Lips, mucosa, and tongue normal; teeth and gums normal  Neck:   Supple, symmetrical, trachea midline, no adenopathy;    thyroid:  no enlargement/tenderness/nodules; no carotid   bruit or JVD  Back:     Symmetric, no curvature, ROM normal, no CVA tenderness  Lungs:     Clear to auscultation bilaterally, respirations unlabored  Chest Wall:    No tenderness or deformity   Heart:    Regular rate and rhythm, S1 and S2  normal, no murmur, rub   or gallop  Breast Exam:    No tenderness, masses, or nipple abnormality  Abdomen:     Soft, non-tender, bowel sounds active all four quadrants,    no masses, no organomegaly  Genitalia:    Normal female without lesion, discharge or tenderness, 16 week size uterus, mobile, NT     Extremities:   Extremities normal, atraumatic, no cyanosis or edema  Pulses:   2+ and symmetric all extremities  Skin:   Skin color, texture, turgor normal, no rashes or lesions  Lymph nodes:   Cervical, supraclavicular, and axillary nodes normal  Neurologic:   CNII-XII intact, normal strength, sensation and reflexes    throughout  .    Assessment:    Healthy female exam.   Recurrent BV DUB/heavy periods/fibroids   Plan:     Thin prep Pap smear. with cotesting Refills of flagyl and diflucan Rec probiotics and boric acid suppositories prn Mammogram ordered CBC, another gyn u/s Pretreat with cytotec, xanax, IBU

## 2017-05-04 LAB — CBC
HEMOGLOBIN: 12.6 g/dL (ref 11.1–15.9)
Hematocrit: 37.4 % (ref 34.0–46.6)
MCH: 31.2 pg (ref 26.6–33.0)
MCHC: 33.7 g/dL (ref 31.5–35.7)
MCV: 93 fL (ref 79–97)
PLATELETS: 243 10*3/uL (ref 150–379)
RBC: 4.04 x10E6/uL (ref 3.77–5.28)
RDW: 14.7 % (ref 12.3–15.4)
WBC: 3.4 10*3/uL (ref 3.4–10.8)

## 2017-05-04 LAB — CYTOLOGY - PAP
CHLAMYDIA, DNA PROBE: NEGATIVE
Diagnosis: NEGATIVE
HPV: NOT DETECTED
NEISSERIA GONORRHEA: NEGATIVE

## 2017-05-08 ENCOUNTER — Ambulatory Visit (HOSPITAL_COMMUNITY): Payer: BLUE CROSS/BLUE SHIELD

## 2017-05-10 ENCOUNTER — Ambulatory Visit (HOSPITAL_COMMUNITY)
Admission: RE | Admit: 2017-05-10 | Discharge: 2017-05-10 | Disposition: A | Discharge status: Home / Self Care | Payer: BLUE CROSS/BLUE SHIELD | Source: Ambulatory Visit | Attending: Obstetrics & Gynecology | Admitting: Obstetrics & Gynecology

## 2017-05-10 DIAGNOSIS — N938 Other specified abnormal uterine and vaginal bleeding: Secondary | ICD-10-CM | POA: Insufficient documentation

## 2017-05-10 DIAGNOSIS — N852 Hypertrophy of uterus: Secondary | ICD-10-CM | POA: Insufficient documentation

## 2017-05-24 ENCOUNTER — Ambulatory Visit (INDEPENDENT_AMBULATORY_CARE_PROVIDER_SITE_OTHER): Payer: BLUE CROSS/BLUE SHIELD | Admitting: Obstetrics & Gynecology

## 2017-05-24 ENCOUNTER — Encounter: Payer: Self-pay | Admitting: Obstetrics & Gynecology

## 2017-05-24 ENCOUNTER — Other Ambulatory Visit (HOSPITAL_COMMUNITY)
Admission: RE | Admit: 2017-05-24 | Discharge: 2017-05-24 | Disposition: A | Discharge status: Home / Self Care | Payer: BLUE CROSS/BLUE SHIELD | Source: Ambulatory Visit | Attending: Obstetrics & Gynecology | Admitting: Obstetrics & Gynecology

## 2017-05-24 VITALS — BP 126/78 | HR 84 | Ht 64.0 in | Wt 142.4 lb

## 2017-05-24 DIAGNOSIS — N938 Other specified abnormal uterine and vaginal bleeding: Secondary | ICD-10-CM | POA: Insufficient documentation

## 2017-05-24 NOTE — Progress Notes (Signed)
   Subjective:    Patient ID: Mariah Nelson, female    DOB: 01-21-1966, 51 y.o.   MRN: [id number]  HPI  51 yo MP1 here her [facility] for DUB. Her u/s showed several fibroids, the largest was 7x6x5 cm.  Review of Systems Her child was delivered via NSVD.    Objective:   Physical Exam WNWHFNAD Breathing, conversing, and ambulating normally  UPT negative, consent signed, time out done Cervix prepped with betadine and grasped with a single tooth tenaculum Uterus sounded to 9 cm Pipelle used for 1 pass with a large amount of tissue obtained. She tolerated the procedure well.      Assessment & Plan:  DUB- await pathology Consider TVH with SPINAL anesthesia

## 2017-05-25 ENCOUNTER — Ambulatory Visit: Payer: BLUE CROSS/BLUE SHIELD

## 2017-05-31 ENCOUNTER — Encounter: Payer: Self-pay | Admitting: Obstetrics & Gynecology

## 2017-05-31 ENCOUNTER — Ambulatory Visit (INDEPENDENT_AMBULATORY_CARE_PROVIDER_SITE_OTHER): Payer: BLUE CROSS/BLUE SHIELD | Admitting: Obstetrics & Gynecology

## 2017-05-31 VITALS — BP 147/93 | HR 88 | Ht 64.0 in | Wt 145.0 lb

## 2017-05-31 DIAGNOSIS — D259 Leiomyoma of uterus, unspecified: Secondary | ICD-10-CM

## 2017-05-31 DIAGNOSIS — N938 Other specified abnormal uterine and vaginal bleeding: Secondary | ICD-10-CM

## 2017-05-31 NOTE — Progress Notes (Signed)
Last note - DUB- await pathology Consider TVH with SPINAL anesthesia

## 2017-05-31 NOTE — Progress Notes (Signed)
   Subjective:    Patient ID: Mariah Nelson, female    DOB: 07-May-1966, 51 y.o.   MRN: [id number]  HPI 51 yo S P1 here to discuss surgery for her DUB, fibroids. For the last year she has been having hot flashes and night sweats.  Review of Systems She has had a BTL.    Objective:   Physical Exam WNWHFNAD Breathing, conversing, and ambulating normally 16 week size uterus        Assessment & Plan:  DUB, fibroids- she would like TAH/BSO (uterus too big and bulky for [facility]). She understands that she will be fully postmenopausal and may want ERT. She would like a spinal. Does NOT want to go to sleep/general anesthesia.

## 2017-06-15 ENCOUNTER — Ambulatory Visit: Payer: BLUE CROSS/BLUE SHIELD | Admitting: Obstetrics & Gynecology

## 2017-07-02 ENCOUNTER — Encounter (HOSPITAL_COMMUNITY): Payer: Self-pay

## 2017-08-21 ENCOUNTER — Encounter (HOSPITAL_COMMUNITY): Admission: RE | Payer: Self-pay | Source: Ambulatory Visit

## 2017-08-21 ENCOUNTER — Inpatient Hospital Stay (HOSPITAL_COMMUNITY)
Admission: RE | Admit: 2017-08-21 | Payer: BLUE CROSS/BLUE SHIELD | Source: Ambulatory Visit | Admitting: Obstetrics & Gynecology

## 2017-08-21 SURGERY — HYSTERECTOMY, ABDOMINAL
Anesthesia: Choice | Laterality: Bilateral

## 2017-08-27 ENCOUNTER — Encounter (HOSPITAL_COMMUNITY): Payer: Self-pay

## 2017-10-17 IMAGING — US US TRANSVAGINAL NON-OB
1 series · 15 of 25 positions shown · non-contrast
Comparison: None

CLINICAL DATA: Dysfunctional uterine bleeding, frequent periods
every 2 weeks, heavy and painful.



[Series 1: us transvaginal non-ob · 84 acquisitions, 15 frames shown]
[im 1/84]
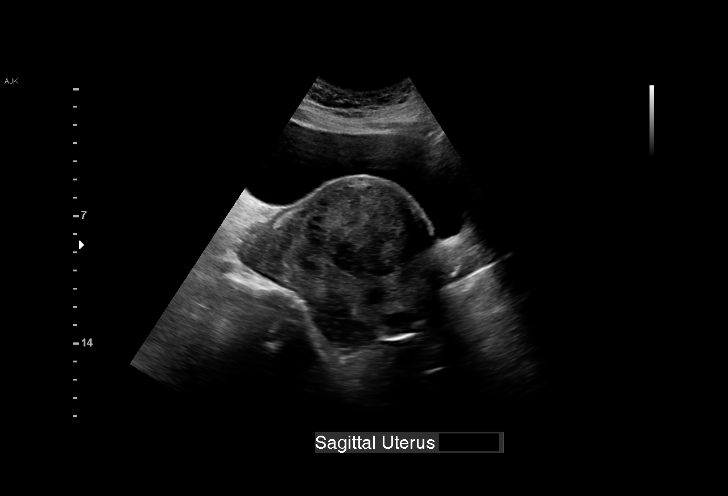
[im 7/84]
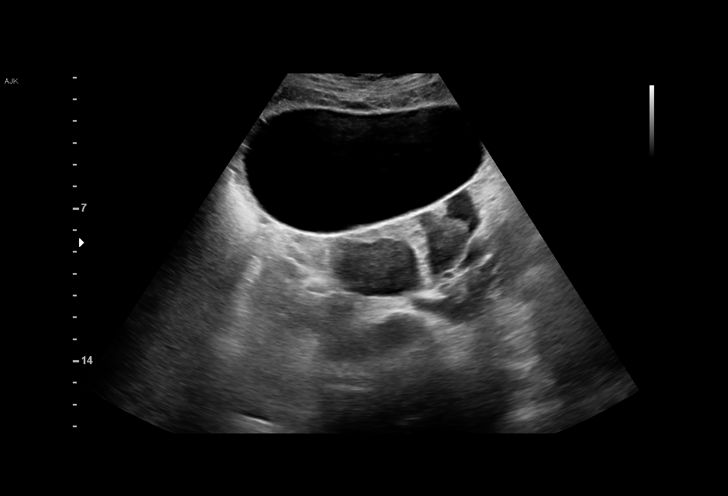
[im 14/84]
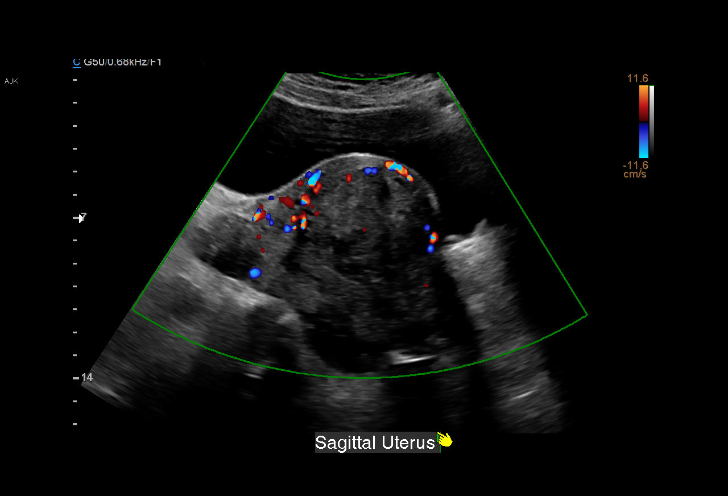
[im 18/84]
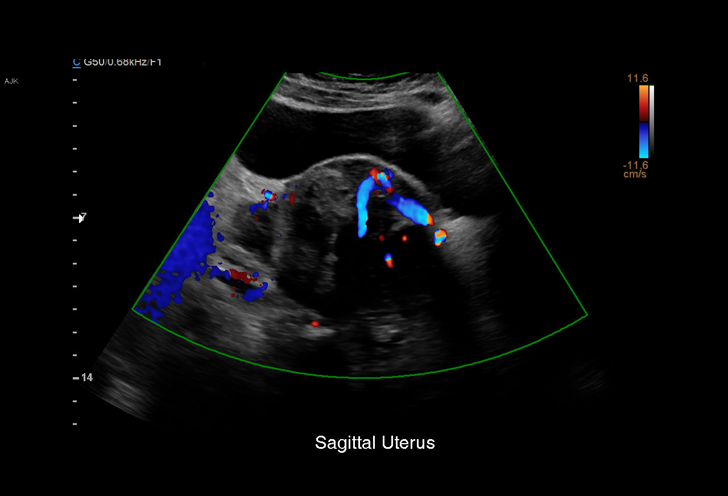
[im 25/84]
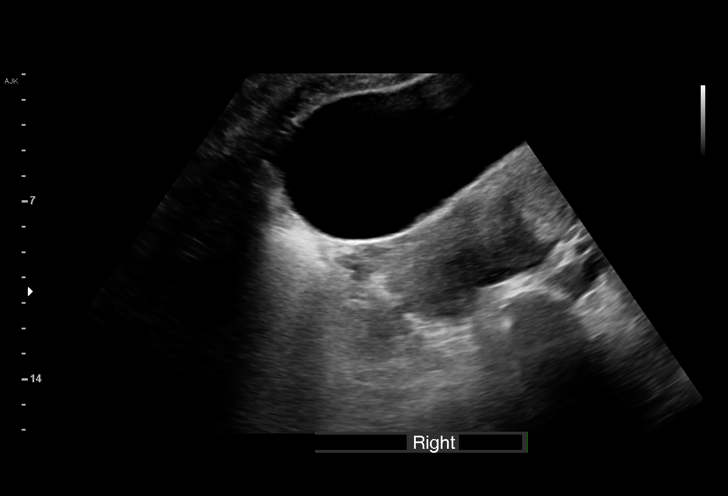
[im 32/84]
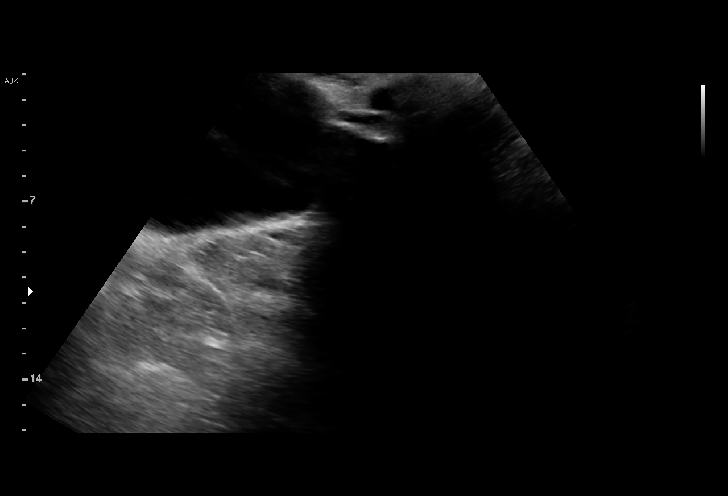
[im 35/84]
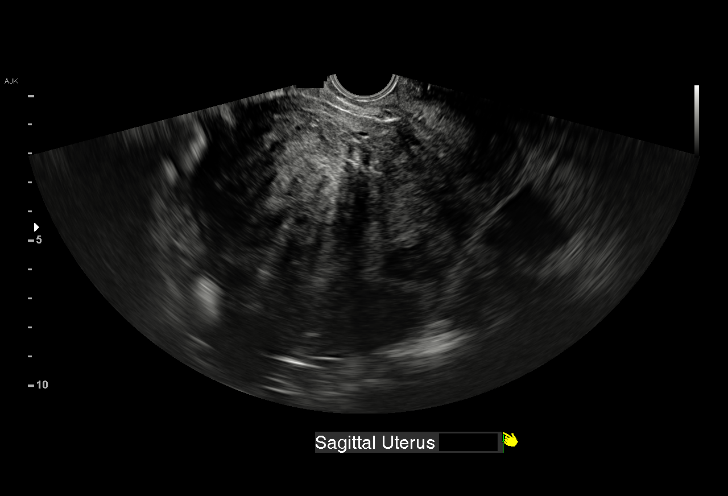
[im 42/84]
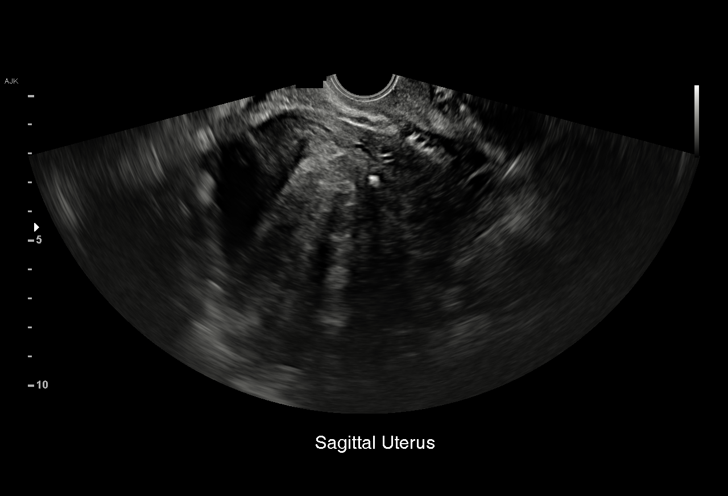
[im 49/84]
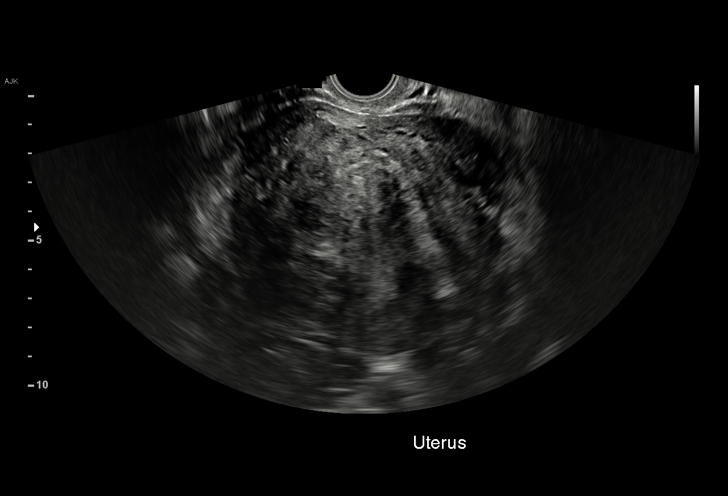
[im 52/84]
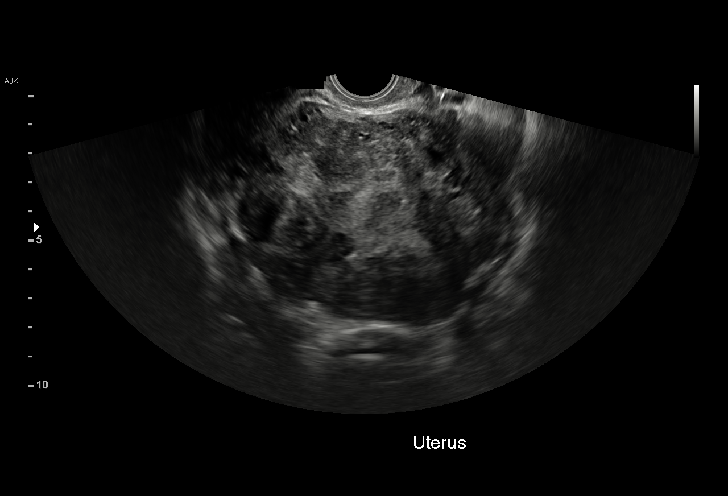
[im 59/84]
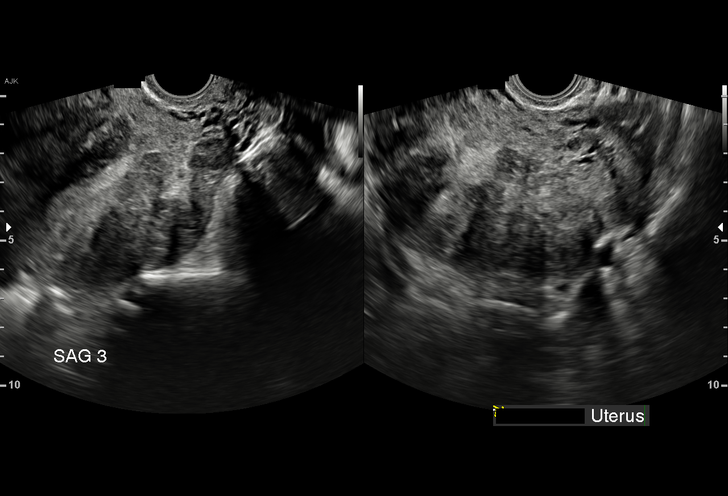
[im 66/84]
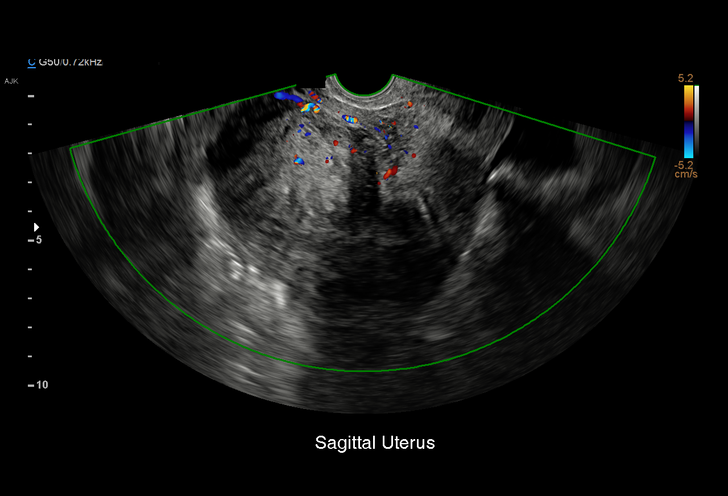
[im 70/84]
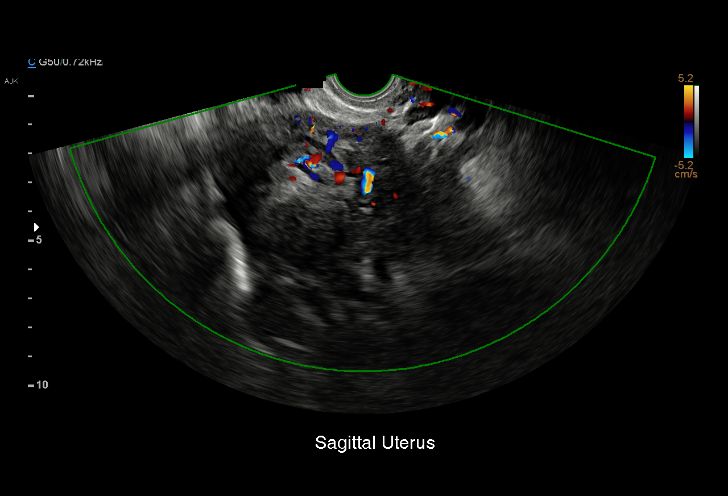
[im 77/84]
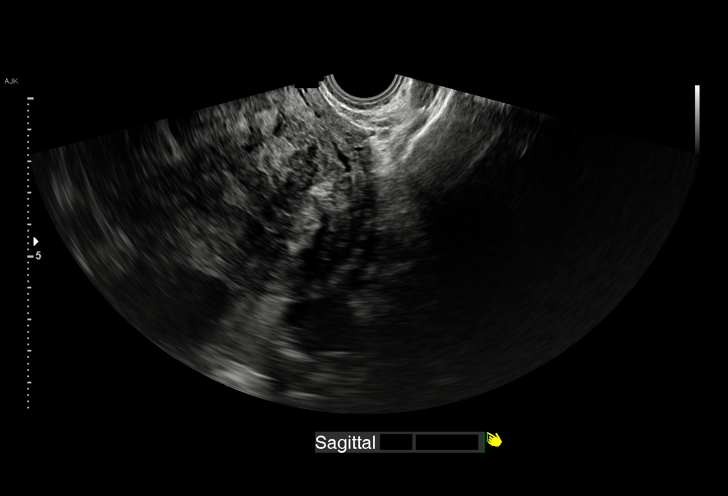
[im 84/84]
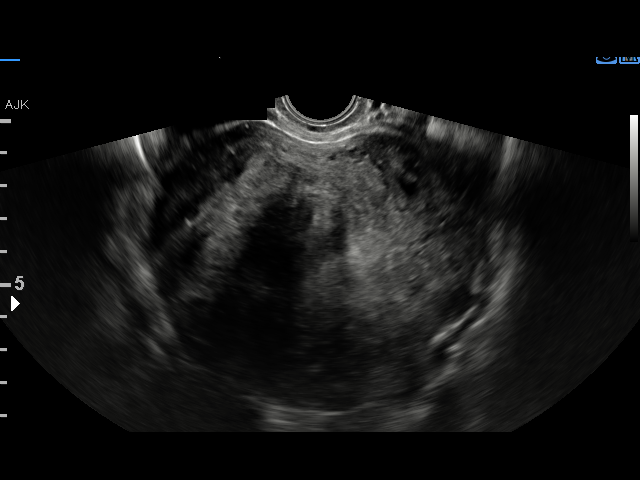

[15 of 25 positions shown; findings below may reference images not displayed]

FINDINGS: Uterus

Measurements: 11.8 x 9.3 x 10.0 cm. Multiple uterine wall nodules
compatible with uterine fibroids. The largest lesion is at the
anterior upper uterine segment 7.2 x 5.3 x 6.4 cm, transmural extend
from serosa to submucosal. Additional leiomyomata measure 3.0 x
x 4.3 cm and 3.2 x 2.8 x 4.3 cm at the upper to mid uterus.

Endometrium

Thickness: 14 mm thick. Distorted by uterine leiomyomata. No
definite endometrial fluid or focal mass

Right ovary

Incompletely visualized likely due to a combination of bowel gas and
displacement by an enlarged uterus

Left ovary

Inadequately visualized likely due to a combination of bowel gas and
displacement by an enlarged uterus

Other findings

Trace free pelvic fluid.  No adnexal masses.
IMPRESSION: Enlarged uterus containing multiple leiomyomata largest of which
measures 7.2 cm in greatest size and is transmural at the anterior
upper uterine segment.

Nonvisualization of ovaries.

## 2017-10-19 NOTE — Patient Instructions (Addendum)
Your procedure is scheduled on: Thursday November 01, 2017 at 7:30 am  Enter through the [employer] [facility] at: 6:00 am  Pick up the phone at the desk and dial [phone].  Call this number if you have problems the morning of surgery: [phone].  Remember: Do NOT eat food or drink any liquids after: Midnight on November Wednesday November 14  Take these medicines the morning of surgery with a SIP OF WATER: AmLODipine  STOP ALL VITAMINS AND SUPPLEMENTS ONE WEEK PRIOR TO SURGEY  DO NOT SMOKE DAY OF SURGERY  Do NOT wear jewelry (body piercing), metal hair clips/bobby pins, make-up, artificial eye lashes or nail polish. Do NOT wear lotions, powders, or perfumes.  You may wear deoderant. Do NOT shave for 48 hours prior to surgery. Do NOT bring valuables to the hospital. Contacts, dentures, or bridgework may not be worn into surgery. Leave suitcase in car.  After surgery it may be brought to your room.    For patients admitted to the hospital, checkout time is 11:00 AM the day of discharge.

## 2017-10-23 ENCOUNTER — Ambulatory Visit (INDEPENDENT_AMBULATORY_CARE_PROVIDER_SITE_OTHER): Payer: BLUE CROSS/BLUE SHIELD | Admitting: Obstetrics & Gynecology

## 2017-10-23 VITALS — BP 135/99 | HR 99 | Wt 141.1 lb

## 2017-10-23 DIAGNOSIS — N938 Other specified abnormal uterine and vaginal bleeding: Secondary | ICD-10-CM

## 2017-10-23 DIAGNOSIS — Z029 Encounter for administrative examinations, unspecified: Secondary | ICD-10-CM

## 2017-10-23 MED ORDER — OXYCODONE-ACETAMINOPHEN 5-325 MG PO TABS: 1.0000 | ORAL_TABLET | Freq: Four times a day (QID) | ORAL | 0 refills | Status: AC | PRN

## 2017-10-23 MED ORDER — ESTRADIOL 0.1 MG/24HR TD PTWK: 0.1000 mg | MEDICATED_PATCH | TRANSDERMAL | 12 refills | Status: AC

## 2017-10-23 NOTE — Progress Notes (Signed)
   Subjective:    Patient ID: Mariah Nelson, female    DOB: 24-Apr-1966, 51 y.o.   MRN: [id number]  HPI 51 yo lady here for questions prior to her TAH/BS next week.   Review of Systems She is currently abstinent.    Objective:   Physical Exam Breathing, conversing, and ambulating normally Abd- benign    Assessment & Plan:  She would like a note for work excusing her for work for 6 weeks. She also has turned in FMLA forms I have prescribed her postop op percocet and climara patch 0.1 mg

## 2017-10-24 ENCOUNTER — Encounter (HOSPITAL_COMMUNITY): Payer: Self-pay

## 2017-10-24 ENCOUNTER — Other Ambulatory Visit: Payer: Self-pay

## 2017-10-24 ENCOUNTER — Inpatient Hospital Stay (HOSPITAL_COMMUNITY)
Admission: RE | Admit: 2017-10-24 | Discharge: 2017-10-24 | Disposition: A | Discharge status: Home / Self Care | Payer: BLUE CROSS/BLUE SHIELD | Source: Ambulatory Visit

## 2017-10-24 ENCOUNTER — Encounter (HOSPITAL_COMMUNITY)
Admission: RE | Admit: 2017-10-24 | Discharge: 2017-10-24 | Disposition: A | Discharge status: Home / Self Care | Payer: BLUE CROSS/BLUE SHIELD | Source: Ambulatory Visit | Attending: Obstetrics & Gynecology | Admitting: Obstetrics & Gynecology

## 2017-10-24 DIAGNOSIS — Z01812 Encounter for preprocedural laboratory examination: Secondary | ICD-10-CM | POA: Insufficient documentation

## 2017-10-24 DIAGNOSIS — I10 Essential (primary) hypertension: Secondary | ICD-10-CM | POA: Insufficient documentation

## 2017-10-24 DIAGNOSIS — Z01810 Encounter for preprocedural cardiovascular examination: Secondary | ICD-10-CM | POA: Diagnosis not present

## 2017-10-24 DIAGNOSIS — J45909 Unspecified asthma, uncomplicated: Secondary | ICD-10-CM | POA: Diagnosis not present

## 2017-10-24 HISTORY — DX: Headache: R51

## 2017-10-24 HISTORY — DX: Unspecified asthma, uncomplicated: J45.909

## 2017-10-24 HISTORY — DX: Gastro-esophageal reflux disease without esophagitis: K21.9

## 2017-10-24 HISTORY — DX: Headache, unspecified: R51.9

## 2017-10-24 HISTORY — DX: Anemia, unspecified: D64.9

## 2017-10-24 HISTORY — DX: Pneumonia, unspecified organism: J18.9

## 2017-10-24 LAB — CBC
HEMATOCRIT: 36.1 % (ref 36.0–46.0)
Hemoglobin: 12.7 g/dL (ref 12.0–15.0)
MCH: 32.1 pg (ref 26.0–34.0)
MCHC: 35.2 g/dL (ref 30.0–36.0)
MCV: 91.2 fL (ref 78.0–100.0)
PLATELETS: 266 10*3/uL (ref 150–400)
RBC: 3.96 MIL/uL (ref 3.87–5.11)
RDW: 13.3 % (ref 11.5–15.5)
WBC: 3.4 10*3/uL — AB (ref 4.0–10.5)

## 2017-10-24 LAB — TYPE AND SCREEN
ABO/RH(D): O POS
ANTIBODY SCREEN: NEGATIVE

## 2017-10-24 LAB — ABO/RH: ABO/RH(D): O POS

## 2017-10-24 NOTE — Anesthesia Preprocedure Evaluation (Addendum)
Anesthesia Evaluation  Patient identified by MRN, date of birth, ID band Patient awake    Reviewed: Allergy & Precautions, NPO status , Patient's Chart, lab work & pertinent test results  Airway Mallampati: II  TM Distance: >3 FB Neck ROM: Full    Dental no notable dental hx.    Pulmonary asthma ,    Pulmonary exam normal breath sounds clear to auscultation       Cardiovascular hypertension, Pt. on medications Normal cardiovascular exam Rhythm:Regular Rate:Normal     Neuro/Psych negative neurological ROS  negative psych ROS   GI/Hepatic negative GI ROS, Neg liver ROS,   Endo/Other  negative endocrine ROS  Renal/GU negative Renal ROS  negative genitourinary   Musculoskeletal negative musculoskeletal ROS (+)   Abdominal   Peds negative pediatric ROS (+)  Hematology negative hematology ROS (+)   Anesthesia Other Findings   Reproductive/Obstetrics negative OB ROS                            Anesthesia Physical Anesthesia Plan  ASA: II  Anesthesia Plan: Spinal   Post-op Pain Management:    Induction:   PONV Risk Score and Plan: 2 and Ondansetron and Dexamethasone  Airway Management Planned: Simple Face Mask  Additional Equipment:   Intra-op Plan:   Post-operative Plan:   Informed Consent: I have reviewed the patients History and Physical, chart, labs and discussed the procedure including the risks, benefits and alternatives for the proposed anesthesia with the patient or authorized representative who has indicated his/her understanding and acceptance.   Dental advisory given  Plan Discussed with:   Anesthesia Plan Comments: (Pt describes airway difficulties during surgery in 2007 at [facility].  We have requested records.  Reports her "airway closed up", had airway bleeding and required ICU stay.  No problems with previous anesthetics. Airway exam: normal. )        Anesthesia Quick Evaluation

## 2017-10-30 ENCOUNTER — Encounter (INDEPENDENT_AMBULATORY_CARE_PROVIDER_SITE_OTHER): Payer: Self-pay | Admitting: *Deleted

## 2017-10-30 DIAGNOSIS — Z029 Encounter for administrative examinations, unspecified: Secondary | ICD-10-CM

## 2017-10-31 ENCOUNTER — Encounter (HOSPITAL_COMMUNITY): Payer: Self-pay | Admitting: Anesthesiology

## 2017-11-01 ENCOUNTER — Encounter (HOSPITAL_COMMUNITY)
Admission: RE | Disposition: A | Discharge status: Home / Self Care | Payer: Self-pay | Source: Ambulatory Visit | Attending: Obstetrics & Gynecology

## 2017-11-01 ENCOUNTER — Encounter (HOSPITAL_COMMUNITY): Payer: Self-pay | Admitting: *Deleted

## 2017-11-01 ENCOUNTER — Other Ambulatory Visit: Payer: Self-pay

## 2017-11-01 ENCOUNTER — Inpatient Hospital Stay (HOSPITAL_COMMUNITY)
Admission: RE | Admit: 2017-11-01 | Discharge: 2017-11-02 | DRG: 743 | Disposition: A | Discharge status: Home / Self Care | Payer: BLUE CROSS/BLUE SHIELD | Source: Ambulatory Visit | Attending: Obstetrics & Gynecology | Admitting: Obstetrics & Gynecology

## 2017-11-01 ENCOUNTER — Inpatient Hospital Stay (HOSPITAL_COMMUNITY): Payer: BLUE CROSS/BLUE SHIELD | Admitting: Anesthesiology

## 2017-11-01 DIAGNOSIS — D259 Leiomyoma of uterus, unspecified: Principal | ICD-10-CM | POA: Diagnosis present

## 2017-11-01 DIAGNOSIS — Z91040 Latex allergy status: Secondary | ICD-10-CM

## 2017-11-01 DIAGNOSIS — D649 Anemia, unspecified: Secondary | ICD-10-CM | POA: Diagnosis present

## 2017-11-01 DIAGNOSIS — K219 Gastro-esophageal reflux disease without esophagitis: Secondary | ICD-10-CM | POA: Diagnosis present

## 2017-11-01 DIAGNOSIS — N938 Other specified abnormal uterine and vaginal bleeding: Secondary | ICD-10-CM | POA: Diagnosis present

## 2017-11-01 DIAGNOSIS — I10 Essential (primary) hypertension: Secondary | ICD-10-CM | POA: Diagnosis present

## 2017-11-01 DIAGNOSIS — Z9889 Other specified postprocedural states: Secondary | ICD-10-CM

## 2017-11-01 HISTORY — DX: Herpesviral infection, unspecified: B00.9

## 2017-11-01 HISTORY — PX: CYSTOSCOPY: SHX5120

## 2017-11-01 HISTORY — DX: Irritable bowel syndrome, unspecified: K58.9

## 2017-11-01 LAB — CBC
HCT: 32.8 % — ABNORMAL LOW (ref 36.0–46.0)
Hemoglobin: 11.1 g/dL — ABNORMAL LOW (ref 12.0–15.0)
MCH: 31.9 pg (ref 26.0–34.0)
MCHC: 33.8 g/dL (ref 30.0–36.0)
MCV: 94.3 fL (ref 78.0–100.0)
Platelets: 205 K/uL (ref 150–400)
RBC: 3.48 MIL/uL — ABNORMAL LOW (ref 3.87–5.11)
RDW: 13.5 % (ref 11.5–15.5)
WBC: 12.5 K/uL — ABNORMAL HIGH (ref 4.0–10.5)

## 2017-11-01 LAB — PREGNANCY, URINE: Preg Test, Ur: NEGATIVE

## 2017-11-01 LAB — BASIC METABOLIC PANEL
Anion gap: 6 (ref 5–15)
BUN: 9 mg/dL (ref 6–20)
CO2: 26 mmol/L (ref 22–32)
Calcium: 9.3 mg/dL (ref 8.9–10.3)
Chloride: 104 mmol/L (ref 101–111)
Creatinine, Ser: 0.67 mg/dL (ref 0.44–1.00)
GFR calc Af Amer: >60 mL/min (ref >60)
GFR calc non Af Amer: >60 mL/min (ref >60)
Glucose, Bld: 102 mg/dL — ABNORMAL HIGH (ref 65–99)
Potassium: 3.7 mmol/L (ref 3.5–5.1)
Sodium: 136 mmol/L (ref 135–145)

## 2017-11-01 LAB — POCT I-STAT EG7
Acid-base deficit: 2 mmol/L (ref 0.0–2.0)
BICARBONATE: 25.6 mmol/L (ref 20.0–28.0)
Calcium, Ion: 1.29 mmol/L (ref 1.15–1.40)
HEMATOCRIT: 29 % — AB (ref 36.0–46.0)
Hemoglobin: 9.9 g/dL — ABNORMAL LOW (ref 12.0–15.0)
O2 Saturation: 63 %
PCO2 VEN: 54.8 mmHg (ref 44.0–60.0)
PH VEN: 7.278 (ref 7.250–7.430)
Potassium: 3.9 mmol/L (ref 3.5–5.1)
SODIUM: 140 mmol/L (ref 135–145)
TCO2: 27 mmol/L (ref 22–32)
pO2, Ven: 38 mmHg (ref 32.0–45.0)

## 2017-11-01 SURGERY — HYSTERECTOMY, ABDOMINAL, WITH SALPINGO-OOPHORECTOMY
Anesthesia: Spinal

## 2017-11-01 MED ORDER — ONDANSETRON HCL 4 MG/2ML IJ SOLN
4.0000 mg | Freq: Three times a day (TID) | INTRAMUSCULAR | Status: DC | PRN
  Filled 2017-11-01: qty 2

## 2017-11-01 MED ORDER — MIDAZOLAM HCL 2 MG/2ML IJ SOLN
INTRAMUSCULAR | Status: AC
  Filled 2017-11-01: qty 2

## 2017-11-01 MED ORDER — OXYCODONE-ACETAMINOPHEN 5-325 MG PO TABS
1.0000 | ORAL_TABLET | ORAL | Status: DC | PRN
  Administered 2017-11-02 (×2): 1 via ORAL
  Filled 2017-11-01 (×2): qty 1

## 2017-11-01 MED ORDER — BUPIVACAINE HCL (PF) 0.5 % IJ SOLN
INTRAMUSCULAR | Status: DC | PRN
  Administered 2017-11-01: 30 mL

## 2017-11-01 MED ORDER — ONDANSETRON HCL 4 MG/2ML IJ SOLN
4.0000 mg | Freq: Four times a day (QID) | INTRAMUSCULAR | Status: DC | PRN
  Administered 2017-11-01: 4 mg via INTRAVENOUS

## 2017-11-01 MED ORDER — KETOROLAC TROMETHAMINE 30 MG/ML IJ SOLN
30.0000 mg | Freq: Four times a day (QID) | INTRAMUSCULAR | Status: AC | PRN
  Administered 2017-11-01: 30 mg via INTRAVENOUS
  Filled 2017-11-01: qty 1

## 2017-11-01 MED ORDER — BUPIVACAINE IN DEXTROSE 0.75-8.25 % IT SOLN
INTRATHECAL | Status: AC
  Filled 2017-11-01: qty 2

## 2017-11-01 MED ORDER — ACETAMINOPHEN 500 MG PO TABS
1000.0000 mg | ORAL_TABLET | Freq: Four times a day (QID) | ORAL | Status: AC
  Administered 2017-11-01 – 2017-11-02 (×3): 1000 mg via ORAL
  Filled 2017-11-01 (×3): qty 2

## 2017-11-01 MED ORDER — METOCLOPRAMIDE HCL 5 MG/ML IJ SOLN
10.0000 mg | Freq: Once | INTRAMUSCULAR | Status: DC | PRN
  Filled 2017-11-01: qty 2

## 2017-11-01 MED ORDER — ONDANSETRON HCL 4 MG/2ML IJ SOLN
INTRAMUSCULAR | Status: AC
  Filled 2017-11-01: qty 2

## 2017-11-01 MED ORDER — NALOXONE HCL 0.4 MG/ML IJ SOLN: 1.0000 ug/kg/h | INTRAMUSCULAR | Status: DC | PRN

## 2017-11-01 MED ORDER — FENTANYL CITRATE (PF) 100 MCG/2ML IJ SOLN
25.0000 ug | INTRAMUSCULAR | Status: DC | PRN
  Administered 2017-11-01 (×2): 25 ug via INTRAVENOUS
  Administered 2017-11-01: 10:00:00 via INTRAVENOUS

## 2017-11-01 MED ORDER — CEFAZOLIN SODIUM-DEXTROSE 2-4 GM/100ML-% IV SOLN
2.0000 g | INTRAVENOUS | Status: AC
  Administered 2017-11-01: 2 g via INTRAVENOUS
  Filled 2017-11-01: qty 100

## 2017-11-01 MED ORDER — MORPHINE SULFATE (PF) 0.5 MG/ML IJ SOLN
INTRAMUSCULAR | Status: DC | PRN
  Administered 2017-11-01: .2 mg via INTRATHECAL

## 2017-11-01 MED ORDER — MIDAZOLAM HCL 2 MG/2ML IJ SOLN
INTRAMUSCULAR | Status: DC | PRN
  Administered 2017-11-01 (×2): 1 mg via INTRAVENOUS

## 2017-11-01 MED ORDER — ONDANSETRON HCL 4 MG PO TABS: 4.0000 mg | ORAL_TABLET | Freq: Four times a day (QID) | ORAL | Status: DC | PRN

## 2017-11-01 MED ORDER — NALBUPHINE HCL 10 MG/ML IJ SOLN: 5.0000 mg | Freq: Once | INTRAMUSCULAR | Status: DC | PRN

## 2017-11-01 MED ORDER — CEFAZOLIN SODIUM-DEXTROSE 2-3 GM-%(50ML) IV SOLR
INTRAVENOUS | Status: AC
  Filled 2017-11-01: qty 50

## 2017-11-01 MED ORDER — NALOXONE HCL 0.4 MG/ML IJ SOLN: 0.4000 mg | INTRAMUSCULAR | Status: DC | PRN

## 2017-11-01 MED ORDER — PROPOFOL 500 MG/50ML IV EMUL
INTRAVENOUS | Status: DC | PRN
  Administered 2017-11-01: 50 ug/kg/min via INTRAVENOUS

## 2017-11-01 MED ORDER — NALBUPHINE HCL 10 MG/ML IJ SOLN: 5.0000 mg | INTRAMUSCULAR | Status: DC | PRN

## 2017-11-01 MED ORDER — HYDROCHLOROTHIAZIDE 12.5 MG PO CAPS
12.5000 mg | ORAL_CAPSULE | Freq: Every day | ORAL | Status: DC | PRN
  Filled 2017-11-01: qty 1

## 2017-11-01 MED ORDER — KETOROLAC TROMETHAMINE 30 MG/ML IJ SOLN
INTRAMUSCULAR | Status: AC
  Filled 2017-11-01: qty 1

## 2017-11-01 MED ORDER — MEPERIDINE HCL 25 MG/ML IJ SOLN: 6.2500 mg | INTRAMUSCULAR | Status: DC | PRN

## 2017-11-01 MED ORDER — SCOPOLAMINE 1 MG/3DAYS TD PT72
1.0000 | MEDICATED_PATCH | Freq: Once | TRANSDERMAL | Status: DC
  Administered 2017-11-01: 1.5 mg via TRANSDERMAL

## 2017-11-01 MED ORDER — SODIUM CHLORIDE 0.9% FLUSH: 3.0000 mL | INTRAVENOUS | Status: DC | PRN

## 2017-11-01 MED ORDER — PHENYLEPHRINE 40 MCG/ML (10ML) SYRINGE FOR IV PUSH (FOR BLOOD PRESSURE SUPPORT)
PREFILLED_SYRINGE | INTRAVENOUS | Status: AC
  Filled 2017-11-01: qty 20

## 2017-11-01 MED ORDER — SCOPOLAMINE 1 MG/3DAYS TD PT72
1.0000 | MEDICATED_PATCH | Freq: Once | TRANSDERMAL | Status: DC
  Filled 2017-11-01: qty 1

## 2017-11-01 MED ORDER — SCOPOLAMINE 1 MG/3DAYS TD PT72
MEDICATED_PATCH | TRANSDERMAL | Status: AC
  Filled 2017-11-01: qty 1

## 2017-11-01 MED ORDER — LACTATED RINGERS IV SOLN
INTRAVENOUS | Status: DC
  Administered 2017-11-01 (×4): via INTRAVENOUS

## 2017-11-01 MED ORDER — ONDANSETRON HCL 4 MG/2ML IJ SOLN
INTRAMUSCULAR | Status: DC | PRN
  Administered 2017-11-01: 4 mg via INTRAVENOUS

## 2017-11-01 MED ORDER — PROPOFOL 10 MG/ML IV BOLUS
INTRAVENOUS | Status: AC
  Filled 2017-11-01: qty 40

## 2017-11-01 MED ORDER — EPINEPHRINE PF 1 MG/10ML IJ SOSY
PREFILLED_SYRINGE | INTRAMUSCULAR | Status: AC
  Filled 2017-11-01: qty 10

## 2017-11-01 MED ORDER — LIDOCAINE HCL 1 % IJ SOLN
INTRAMUSCULAR | Status: AC
  Filled 2017-11-01: qty 20

## 2017-11-01 MED ORDER — FENTANYL CITRATE (PF) 100 MCG/2ML IJ SOLN
INTRAMUSCULAR | Status: DC | PRN
  Administered 2017-11-01: 50 ug via INTRAVENOUS
  Administered 2017-11-01: 10 ug via INTRATHECAL
  Administered 2017-11-01: 50 ug via INTRAVENOUS

## 2017-11-01 MED ORDER — DIPHENHYDRAMINE HCL 25 MG PO CAPS: 25.0000 mg | ORAL_CAPSULE | ORAL | Status: DC | PRN

## 2017-11-01 MED ORDER — DEXAMETHASONE SODIUM PHOSPHATE 4 MG/ML IJ SOLN
INTRAMUSCULAR | Status: DC | PRN
  Administered 2017-11-01: 10 mg via INTRAVENOUS

## 2017-11-01 MED ORDER — FENTANYL CITRATE (PF) 100 MCG/2ML IJ SOLN
INTRAMUSCULAR | Status: AC
  Filled 2017-11-01: qty 2

## 2017-11-01 MED ORDER — MORPHINE SULFATE (PF) 0.5 MG/ML IJ SOLN
INTRAMUSCULAR | Status: AC
  Filled 2017-11-01: qty 10

## 2017-11-01 MED ORDER — PHENYLEPHRINE 40 MCG/ML (10ML) SYRINGE FOR IV PUSH (FOR BLOOD PRESSURE SUPPORT)
PREFILLED_SYRINGE | INTRAVENOUS | Status: AC
  Filled 2017-11-01: qty 10

## 2017-11-01 MED ORDER — METHYLENE BLUE 0.5 % INJ SOLN
INTRAVENOUS | Status: DC | PRN
  Administered 2017-11-01: 5 mL via INTRAVENOUS

## 2017-11-01 MED ORDER — ESTRADIOL 0.1 MG/24HR TD PTWK
0.1000 mg | MEDICATED_PATCH | TRANSDERMAL | Status: DC
  Filled 2017-11-01: qty 1

## 2017-11-01 MED ORDER — PHENYLEPHRINE HCL 10 MG/ML IJ SOLN
INTRAMUSCULAR | Status: DC | PRN
  Administered 2017-11-01 (×3): 40 ug via INTRAVENOUS
  Administered 2017-11-01: 80 ug via INTRAVENOUS
  Administered 2017-11-01: 40 ug via INTRAVENOUS
  Administered 2017-11-01: 80 ug via INTRAVENOUS

## 2017-11-01 MED ORDER — DIPHENHYDRAMINE HCL 25 MG PO TABS
25.0000 mg | ORAL_TABLET | Freq: Every evening | ORAL | Status: DC | PRN
  Filled 2017-11-01: qty 1

## 2017-11-01 MED ORDER — DEXAMETHASONE SODIUM PHOSPHATE 10 MG/ML IJ SOLN
INTRAMUSCULAR | Status: AC
  Filled 2017-11-01: qty 1

## 2017-11-01 MED ORDER — AMLODIPINE BESYLATE 5 MG PO TABS
5.0000 mg | ORAL_TABLET | Freq: Every day | ORAL | Status: DC
  Administered 2017-11-02: 5 mg via ORAL
  Filled 2017-11-01: qty 1

## 2017-11-01 MED ORDER — GLYCOPYRROLATE 0.2 MG/ML IJ SOLN
0.2000 mg | Freq: Once | INTRAMUSCULAR | Status: AC
  Administered 2017-11-01: 0.2 mg via INTRAVENOUS

## 2017-11-01 MED ORDER — LACTATED RINGERS IV SOLN: INTRAVENOUS | Status: DC

## 2017-11-01 MED ORDER — DIPHENHYDRAMINE HCL 50 MG/ML IJ SOLN: 12.5000 mg | INTRAMUSCULAR | Status: DC | PRN

## 2017-11-01 MED ORDER — SODIUM BICARBONATE 8.4 % IV SOLN
INTRAVENOUS | Status: AC
  Filled 2017-11-01: qty 50

## 2017-11-01 MED ORDER — BUPIVACAINE IN DEXTROSE 0.75-8.25 % IT SOLN
INTRATHECAL | Status: DC | PRN
  Administered 2017-11-01: 1.5 mL via INTRATHECAL

## 2017-11-01 MED ORDER — LIDOCAINE HCL (CARDIAC) 20 MG/ML IV SOLN
INTRAVENOUS | Status: AC
  Filled 2017-11-01: qty 5

## 2017-11-01 MED ORDER — IBUPROFEN 800 MG PO TABS
800.0000 mg | ORAL_TABLET | Freq: Three times a day (TID) | ORAL | Status: DC | PRN
  Administered 2017-11-02 (×2): 800 mg via ORAL
  Filled 2017-11-01 (×2): qty 1

## 2017-11-01 MED ORDER — HYDROMORPHONE HCL 1 MG/ML IJ SOLN: 0.2000 mg | INTRAMUSCULAR | Status: DC | PRN

## 2017-11-01 MED ORDER — EPHEDRINE SULFATE 50 MG/ML IJ SOLN
INTRAMUSCULAR | Status: AC
  Filled 2017-11-01: qty 1

## 2017-11-01 MED ORDER — KETOROLAC TROMETHAMINE 30 MG/ML IJ SOLN: 30.0000 mg | Freq: Four times a day (QID) | INTRAMUSCULAR | Status: AC | PRN

## 2017-11-01 MED ORDER — METHYLENE BLUE 0.5 % INJ SOLN
INTRAVENOUS | Status: AC
  Filled 2017-11-01: qty 10

## 2017-11-01 MED ORDER — GLYCOPYRROLATE 0.2 MG/ML IJ SOLN
INTRAMUSCULAR | Status: AC
  Administered 2017-11-01: 0.2 mg via INTRAVENOUS
  Filled 2017-11-01: qty 1

## 2017-11-01 MED ORDER — BUPIVACAINE HCL (PF) 0.5 % IJ SOLN
INTRAMUSCULAR | Status: AC
  Filled 2017-11-01: qty 30

## 2017-11-01 MED FILL — Medication: Qty: 1 | Status: AC

## 2017-11-01 SURGICAL SUPPLY — 33 items
CANISTER SUCT 3000ML PPV (MISCELLANEOUS) ×4 IMPLANT
CATH FOLEY LATEX FREE 14FR (CATHETERS) ×2
CATH FOLEY LF 14FR (CATHETERS) ×2 IMPLANT
CLOSURE WOUND 1/2 X4 (GAUZE/BANDAGES/DRESSINGS) ×1
CLOTH BEACON ORANGE TIMEOUT ST (SAFETY) ×4 IMPLANT
CONT PATH 16OZ SNAP LID 3702 (MISCELLANEOUS) ×4 IMPLANT
DECANTER SPIKE VIAL GLASS SM (MISCELLANEOUS) IMPLANT
DRAPE CESAREAN BIRTH W POUCH (DRAPES) ×4 IMPLANT
DRAPE WARM FLUID 44X44 (DRAPE) IMPLANT
DRSG OPSITE POSTOP 4X10 (GAUZE/BANDAGES/DRESSINGS) ×4 IMPLANT
DURAPREP 26ML APPLICATOR (WOUND CARE) ×4 IMPLANT
GAUZE SPONGE 4X4 16PLY XRAY LF (GAUZE/BANDAGES/DRESSINGS) ×4 IMPLANT
GLOVE BIOGEL PI IND STRL 7.0 (GLOVE) ×4 IMPLANT
GLOVE BIOGEL PI INDICATOR 7.0 (GLOVE) ×4
GLOVE NEODERM STER SZ 7 (GLOVE) ×4 IMPLANT
GOWN STRL REUS W/TWL LRG LVL3 (GOWN DISPOSABLE) ×12 IMPLANT
HEMOSTAT ARISTA ABSORB 3G PWDR (MISCELLANEOUS) IMPLANT
NEEDLE SPNL 18GX3.5 QUINCKE PK (NEEDLE) ×4 IMPLANT
NS IRRIG 1000ML POUR BTL (IV SOLUTION) ×4 IMPLANT
PACK ABDOMINAL GYN (CUSTOM PROCEDURE TRAY) ×4 IMPLANT
PAD OB MATERNITY 4.3X12.25 (PERSONAL CARE ITEMS) ×4 IMPLANT
PROTECTOR NERVE ULNAR (MISCELLANEOUS) ×4 IMPLANT
SET CYSTO W/LG BORE CLAMP LF (SET/KITS/TRAYS/PACK) ×4 IMPLANT
SPONGE LAP 18X18 X RAY DECT (DISPOSABLE) ×16 IMPLANT
STRIP CLOSURE SKIN 1/2X4 (GAUZE/BANDAGES/DRESSINGS) ×3 IMPLANT
SUT CHROMIC 3 0 SH 27 (SUTURE) IMPLANT
SUT PDS AB 0 CTX 60 (SUTURE) IMPLANT
SUT VIC AB 0 CT1 36 (SUTURE) ×12 IMPLANT
SUT VIC AB 2-0 CT1 18 (SUTURE) ×12 IMPLANT
SUT VIC AB 3-0 CT1 27 (SUTURE) ×4
SUT VIC AB 3-0 CT1 TAPERPNT 27 (SUTURE) ×4 IMPLANT
SYR 30ML LL (SYRINGE) ×4 IMPLANT
TOWEL OR 17X24 6PK STRL BLUE (TOWEL DISPOSABLE) ×8 IMPLANT

## 2017-11-01 NOTE — Op Note (Signed)
11/01/2017  8:49 AM  PATIENT:  Mariah Nelson  50 y.o. female  PRE-OPERATIVE DIAGNOSIS:  DUB,  Fibroids  POST-OPERATIVE DIAGNOSIS:  DUB, Fibroids  PROCEDURE:  Procedure(s): HYSTERECTOMY ABDOMINAL WITH SALPINGO-OOPHORECTOMY (N/A) CYSTOSCOPY  SURGEON:  Surgeon(s) and Role:    * Tayte Mcwherter, Wilhemina Cash, MD - Primary    * Aletha Halim, MD - Assisting   ASSISTANTS: Linwood Dibbles, MS3   ANESTHESIA:   local and spinal  EBL:  200 mL   BLOOD ADMINISTERED:none  DRAINS: none   LOCAL MEDICATIONS USED:  MARCAINE     SPECIMEN:  Source of Specimen:  uterus, tubes, and ovaries  DISPOSITION OF SPECIMEN:  PATHOLOGY  COUNTS:  YES  TOURNIQUET:  * No tourniquets in log *  DICTATION: .Dragon Dictation  PLAN OF CARE: Admit to inpatient   PATIENT DISPOSITION:  PACU - hemodynamically stable.   Delay start of Pharmacological VTE agent (>24hrs) due to surgical blood loss or risk of bleeding: not applicable     The risks, benefits, and alternatives of surgery were explained, understood, and accepted. Consents were signed. All questions were answered. She was taken to the operating room and spinal anesthesia was applied without complication. Her abdomen and vagina were prepped and draped in the usual sterile fashion. A latex free catheter was placed which drained clear urine throughout the case. After adequate anesthesia was assured a transverse incision was made approximately 2 cm above her symphysis pubis after injecting 30 mL of 0.5% marcaine in the subcutaneous tissue. The incision was carried down through the subcutaneous tissue to the fascia. Bleeding encountered was cauterized with the Bovie. The fascia was scored the midline and the fascial incision was extended bilaterally. The pyramidalis muscles were separated in a transverse fashion using electrosurgical technique. Approximately 2 cm of the rectus muscles were separated in a transverse fashion in the midline using electrosurgical  technique. Hemostasis was maintained. The peritoneum was entered with hemostats and the peritoneal incision was extended bilaterally with the Bovie, taking care to avoid bowel and bladder. The patient was placed in Trendelenburg position and her bowel was packed out of the abdominal cavity. The pelvis was inspected. Her very large uterus filled the entire pelvis. I used towel clamps to elevate the uterus out of the incision. Coker clamps were used to elevate the uterus. The round ligaments were identified clamped cut and ligated. A bladder flap was created anteriorly and the bladder was pushed out of the operative site with a moist lap sponge. The infundibulo ligaments were identified bilaterally. They were clamped, cut, and ligated. Excellent hemostasis was noted. 2-0 Vicryl sutures used throughout this case unless otherwise specified. The uterine vessels were skeletonized, clamped, cut, and doubly ligated. A bladder flap was created anteriorly. The remainder of the cervix was separated from its pelvic attachments using the same clamp, cut, ligate technique. Curved Heaney clamps were used to clamp beneath the cervix. The cervix and uterus were removed and sent to pathology. The vaginal cuff was noted to be hemostatic after placing 2 figure of eight sutures.  All pedicles were noted to be hemostatic. I saw both urethers with peristalsis on the right. Because I did not see peristalsis on the left, I did a cystoscopy after giving her methylene blue.  I was able to see blue urine coming from each uretheral orifice.  The sponges were removed from the pelvis. The rectus muscles were inspected and hemostasis was assured. The fascia was closed with a 0 Vicryl running nonlocking suture. The  subcutaneous tissue was irrigated, clean, dry.  A subcuticular closure was done with 3-0 vicryl suture. She tolerated the procedure well and was taken to the recovery room in stable condition. Her catheter drained clear urine throughout.

## 2017-11-01 NOTE — Transfer of Care (Signed)
Immediate Anesthesia Transfer of Care Note  Patient: Mariah Nelson  Procedure(s) Performed: HYSTERECTOMY ABDOMINAL WITH SALPINGO-OOPHORECTOMY (N/A ) CYSTOSCOPY  Patient Location: PACU  Anesthesia Type:Spinal  Level of Consciousness: awake, alert  and oriented  Airway & Oxygen Therapy: Patient Spontanous Breathing  Post-op Assessment: Report given to RN and Post -op Vital signs reviewed and stable  Post vital signs: Reviewed and stable  Last Vitals:  Vitals:   11/01/17 0631  BP: (!) 144/98  Pulse: 78  Resp: 16  Temp: 37.1 C  SpO2: 100%    Last Pain:  Vitals:   11/01/17 0631  TempSrc: Oral      Patients Stated Pain Goal: 4 (47/34/03 7096)  Complications: No apparent anesthesia complications

## 2017-11-01 NOTE — H&P (Signed)
Mariah Nelson is an 51 y.o. female.Single AA P1 (74 yo son, no grands) here for a TAH/BSO. She has fibroids and DUB. Her embx was normal.    No LMP recorded (lmp unknown).    Past Medical History:  Diagnosis Date  . Allergy   . Anemia   . Asthma    No inhaler  . Depression   . GERD (gastroesophageal reflux disease)   . Headache    sinus  . HSV infection   . Hypertension   . IBS (irritable bowel syndrome)   . Pneumonia    after difficult intubation  . SVD (spontaneous vaginal delivery)    x 1    Past Surgical History:  Procedure Laterality Date  . COLONOSCOPY    . EYE SURGERY    . TUBAL LIGATION    . UPPER GI ENDOSCOPY    . WISDOM TOOTH EXTRACTION      Family History  Problem Relation Age of Onset  . Diabetes Mother   . Hypertension Mother   . Heart disease Father   . Diabetes Father   . Alcohol abuse Father   . Diabetes Sister   . Hypertension Sister     Social History:  reports that  has never smoked. she has never used smokeless tobacco. She reports that she drinks alcohol. She reports that she does not use drugs.  Allergies:  Allergies  Allergen Reactions  . Clarinex [Desloratadine] Itching  . Hydrocodone-Acetaminophen Other (See Comments)    Hallucinations,  Makes her hear things  . Latex Swelling  . Tessalon [Benzonatate] Nausea Only  . Sulfa Antibiotics Rash  . Sulfamethoxazole Rash    Medications Prior to Admission  Medication Sig Dispense Refill Last Dose  . amLODipine (NORVASC) 5 MG tablet Take 5 mg by mouth daily.   3 11/01/2017 at 0530  . Cholecalciferol (VITAMIN D) 2000 units CAPS Take 2,000 Units by mouth daily.   Past Month at Unknown time  . diphenhydrAMINE (BENADRYL) 25 MG tablet Take 25 mg by mouth at bedtime as needed for allergies.    Past Month at Unknown time  . estradiol (CLIMARA - DOSED IN MG/24 HR) 0.1 mg/24hr patch Place 1 patch (0.1 mg total) once a week onto the skin. 4 patch 12   . fexofenadine (ALLEGRA) 180 MG tablet  Take 180 mg by mouth daily as needed for allergies or rhinitis.   10/31/2017 at Unknown time  . ibuprofen (ADVIL,MOTRIN) 200 MG tablet Take 400-600 mg by mouth every 6 (six) hours as needed for moderate pain (Takes 600 mg as needed for back pain/menstrual cramps).    Past Week at Unknown time  . Multiple Vitamins-Minerals (ADULT GUMMY PO) Take 2 each by mouth daily.   Past Week at Unknown time  . oxyCODONE-acetaminophen (PERCOCET/ROXICET) 5-325 MG tablet Take 1-2 tablets every 6 (six) hours as needed by mouth. 30 tablet 0   . ranitidine (ZANTAC) 150 MG tablet Take 150 mg by mouth daily as needed for heartburn.   Past Month at Unknown time  . temazepam (RESTORIL) 30 MG capsule Take 30 mg by mouth at bedtime as needed for sleep.   Past Week at Unknown time  . ALPRAZolam (XANAX) 0.25 MG tablet Take before coming to appt for biopsy (Patient not taking: Reported on 05/24/2017) 1 tablet 0 Not Taking  . fluconazole (DIFLUCAN) 150 MG tablet Take 1 tablet by mouth once and if symptoms persist, take one tablet 72 hours later. (Patient not taking: Reported on 10/23/2017) 1  tablet 6 More than a month at Unknown time  . hydrochlorothiazide (MICROZIDE) 12.5 MG capsule Take 12.5 mg by mouth daily as needed (edema).    More than a month at Unknown time  . metroNIDAZOLE (FLAGYL) 500 MG tablet Take 1 tablet (500 mg total) by mouth 2 (two) times daily. (Patient not taking: Reported on 10/23/2017) 14 tablet 12 More than a month at Unknown time  . misoprostol (CYTOTEC) 200 MCG tablet Take 3 pills by mouth the night before biopsy. (Patient not taking: Reported on 05/24/2017) 3 tablet 0 Not Taking  . polyethylene glycol (MIRALAX / GLYCOLAX) packet Take 17 g by mouth daily as needed for mild constipation.   More than a month at Unknown time  . valACYclovir (VALTREX) 500 MG tablet Take 1,000 mg by mouth See admin instructions. 2 tablets for 3 days as needed for outbreaks   More than a month at Unknown time    ROS  She is a [employer], [employer].  Blood pressure (!) 144/98, pulse 78, temperature 98.7 F (37.1 C), temperature source Oral, resp. rate 16, height 5\' 4"  (1.626 m), weight 64.2 kg (141 lb 9.6 oz), SpO2 100 %. Physical Exam  Heart- rrr Lungs- CTAB Abd- benign  Results for orders placed or performed during the hospital encounter of 11/01/17 (from the past 24 hour(s))  Pregnancy, urine     Status: None   Collection Time: 11/01/17  6:00 AM  Result Value Ref Range   Preg Test, Ur NEGATIVE NEGATIVE  Basic metabolic panel     Status: Abnormal   Collection Time: 11/01/17  6:12 AM  Result Value Ref Range   Sodium 136 135 - 145 mmol/L   Potassium 3.7 3.5 - 5.1 mmol/L   Chloride 104 101 - 111 mmol/L   CO2 26 22 - 32 mmol/L   Glucose, Bld 102 (H) 65 - 99 mg/dL   BUN 9 6 - 20 mg/dL   Creatinine, Ser 0.67 0.44 - 1.00 mg/dL   Calcium 9.3 8.9 - 10.3 mg/dL   GFR calc non Af Amer >60 >60 mL/min   GFR calc Af Amer >60 >60 mL/min   Anion gap 6 5 - 15    No results found.  Assessment/Plan: Symptomatic fibroids-plan for TAH/BSO with spinal per her request.  She understands the risks of surgery, including, but not to infection, bleeding, DVTs, damage to bowel, bladder, ureters. She wishes to proceed.    Emily Filbert 11/01/2017, 6:58 AM

## 2017-11-01 NOTE — Anesthesia Procedure Notes (Signed)
Spinal  Patient location during procedure: OR Staffing Anesthesiologist: Carignan, Peter, MD Performed: anesthesiologist  Preanesthetic Checklist Completed: patient identified, site marked, surgical consent, pre-op evaluation, timeout performed, IV checked, risks and benefits discussed and monitors and equipment checked Spinal Block Patient position: sitting Prep: DuraPrep Patient monitoring: heart rate, continuous pulse ox and blood pressure Approach: right paramedian Location: L3-4 Injection technique: single-shot Needle Needle type: Sprotte  Needle gauge: 24 G Needle length: 9 cm Additional Notes Expiration date of kit checked and confirmed. Patient tolerated procedure well, without complications.       
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
## 2017-11-01 NOTE — Anesthesia Postprocedure Evaluation (Signed)
Anesthesia Post Note  Patient: Mariah Nelson  Procedure(s) Performed: HYSTERECTOMY ABDOMINAL WITH SALPINGO-OOPHORECTOMY (N/A ) CYSTOSCOPY     Patient location during evaluation: PACU Anesthesia Type: Spinal Level of consciousness: awake and alert Pain management: pain level controlled Vital Signs Assessment: post-procedure vital signs reviewed and stable Respiratory status: spontaneous breathing, nonlabored ventilation, respiratory function stable and patient connected to nasal cannula oxygen Cardiovascular status: blood pressure returned to baseline and stable Postop Assessment: no apparent nausea or vomiting Comments: In PACU had episode of symptomatic bradycardia to 30's requiring recussitation. repsonded to glycco    Last Vitals:  Vitals:   11/01/17 1230 11/01/17 1330  BP: 121/73 106/60  Pulse: 97 89  Resp: 16 16  Temp: 36.5 C 36.7 C  SpO2: 99% 99%    Last Pain:  Vitals:   11/01/17 1330  TempSrc: Oral  PainSc: 1    Pain Goal: Patients Stated Pain Goal: 4 (11/01/17 1330)               Montez Hageman

## 2017-11-01 NOTE — Progress Notes (Signed)
Patient c/o abdominal discomfort. About to give patient IV pain medication when HR suddenly dropped into the 40's, then 30's. Patient stated she felt like her breathing was becoming difficult. Patient then briefly lost consciousness (5 seconds). Code cart at bedside. Pads connected to patient. Dr Marcell Barlow and Dr Luberta Mutter in attendance. BP 70's/30's. Robinul given IV. Patient's HR up 80-90's sinus, BP returned to normal limits.

## 2017-11-02 ENCOUNTER — Encounter (HOSPITAL_COMMUNITY): Payer: Self-pay | Admitting: Obstetrics & Gynecology

## 2017-11-02 LAB — CBC
HEMATOCRIT: 29.1 % — AB (ref 36.0–46.0)
HEMOGLOBIN: 10.1 g/dL — AB (ref 12.0–15.0)
MCH: 32.7 pg (ref 26.0–34.0)
MCHC: 34.7 g/dL (ref 30.0–36.0)
MCV: 94.2 fL (ref 78.0–100.0)
Platelets: 183 10*3/uL (ref 150–400)
RBC: 3.09 MIL/uL — ABNORMAL LOW (ref 3.87–5.11)
RDW: 13.6 % (ref 11.5–15.5)
WBC: 10.1 10*3/uL (ref 4.0–10.5)

## 2017-11-02 NOTE — Discharge Instructions (Signed)
Abdominal Hysterectomy, Care After °This sheet gives you information about how to care for yourself after your procedure. Your doctor may also give you more specific instructions. If you have problems or questions, contact your doctor. °Follow these instructions at home: °Bathing °· Do not take baths, swim, or use a hot tub until your doctor says it is okay. Ask your doctor if you can take showers. You may only be allowed to take sponge baths for bathing. °· Keep the bandage (dressing) dry until your doctor says it can be taken off. °Surgical cut ( °incision) care °· Follow instructions from your doctor about how to take care of your cut from surgery. Make sure you: °? Wash your hands with soap and water before you change your bandage (dressing). If you cannot use soap and water, use hand sanitizer. °? Change your bandage as told by your doctor. °? Leave stitches (sutures), skin glue, or skin tape (adhesive) strips in place. They may need to stay in place for 2 weeks or longer. If tape strips get loose and curl up, you may trim the loose edges. Do not remove tape strips completely unless your doctor says it is okay. °· Check your surgical cut area every day for signs of infection. Check for: °? Redness, swelling, or pain. °? Fluid or blood. °? Warmth. °? Pus or a bad smell. °Activity °· Do gentle, daily exercise as told by your doctor. You may be told to take short walks every day and go farther each time. °· Do not lift anything that is heavier than 10 lb (4.5 kg), or the limit that your doctor tells you, until he or she says that it is safe. °· Do not drive or use heavy machinery while taking prescription pain medicine. °· Do not drive for 24 hours if you were given a medicine to help you relax (sedative). °· Follow your doctor's advice about exercise, driving, and general activities. Ask your doctor what activities are safe for you. °Lifestyle °· Do not douche, use tampons, or have sex for at least 6 weeks or as  told by your doctor. °· Do not drink alcohol until your doctor says it is okay. °· Drink enough fluid to keep your pee (urine) clear or pale yellow. °· Try to have someone at home with you for the first 1-2 weeks to help. °· Do not use any products that contain nicotine or tobacco, such as cigarettes and e-cigarettes. These can slow down healing. If you need help quitting, ask your doctor. °General instructions °· Take over-the-counter and prescription medicines only as told by your doctor. °· Do not take aspirin or ibuprofen. These medicines can cause bleeding. °· To prevent or treat constipation while you are taking prescription pain medicine, your doctor may suggest that you: °? Drink enough fluid to keep your urine clear or pale yellow. °? Take over-the-counter or prescription medicines. °? Eat foods that are high in fiber, such as: °§ Fresh fruits and vegetables. °§ Whole grains. °§ Beans. °? Limit foods that are high in fat and processed sugars, such as fried and sweet foods. °· Keep all follow-up visits as told by your doctor. This is important. °Contact a doctor if: °· You have chills or fever. °· You have redness, swelling, or pain around your cut. °· You have fluid or blood coming from your cut. °· Your cut feels warm to the touch. °· You have pus or a bad smell coming from your cut. °· Your cut breaks   open. °· You feel dizzy or light-headed. °· You have pain or bleeding when you pee. °· You keep having watery poop (diarrhea). °· You keep feeling sick to your stomach (nauseous) or keep throwing up (vomiting). °· You have unusual fluid (discharge) coming from your vagina. °· You have a rash. °· You have a reaction to your medicine. °· Your pain medicine does not help. °Get help right away if: °· You have a fever and your symptoms get worse all of a sudden. °· You have very bad belly (abdominal) pain. °· You are short of breath. °· You pass out (faint). °· You have pain, swelling, or redness of your  leg. °· You bleed a lot from your vagina and notice clumps of blood (clots). °Summary °· Do not take baths, swim, or use a hot tub until your doctor says it is okay. Ask your doctor if you can take showers. You may only be allowed to take sponge baths for bathing. °· Follow your doctor's advice about exercise, driving, and general activities. Ask your doctor what activities are safe for you. °· Do not lift anything that is heavier than 10 lb (4.5 kg), or the limit that your doctor tells you, until he or she says that it is safe. °· Try to have someone at home with you for the first 1-2 weeks to help. °This information is not intended to replace advice given to you by your health care provider. Make sure you discuss any questions you have with your health care provider. °Document Released: 09/12/2008 Document Revised: 11/22/2016 Document Reviewed: 11/22/2016 °Elsevier Interactive Patient Education © 2017 [employer] ° °

## 2017-11-02 NOTE — Discharge Summary (Signed)
Physician Discharge Summary  Patient ID: Mariah Nelson MRN: [id number] DOB/AGE: 08-28-1966 51 y.o.  Admit date: 11/01/2017 Discharge date: 11/02/2017  Admission Diagnoses: symptomatic fibroids  Discharge Diagnoses: same + anemia Active Problems:   Post-operative state   Discharged Condition: good  Hospital Course: She underwent an uncomplicated TAH/BSO/cystoscopy under general anesthesia. By POD #1 she was voiding, tolerating po well, having flatus, ambulating, and she voiced her readiness to go home. She has her prescriptions already filled.   Consults: None  Significant Diagnostic Studies: labs: post op hbg 10, pre op 12.7  Treatments: surgery: as above  Discharge Exam: Blood pressure 106/62, pulse 82, temperature 99.1 F (37.3 C), temperature source Oral, resp. rate 18, height 5\' 4"  (1.626 m), weight 64 kg (141 lb), SpO2 99 %. General appearance: alert Resp: clear to auscultation bilaterally Cardio: regular rate and rhythm, S1, S2 normal, no murmur, click, rub or gallop GI: soft, non-tender; bowel sounds normal; no masses,  no organomegaly  Honeycomb dressing- c/d/i  Disposition: 01-Home or Self Care   Allergies as of 11/02/2017      Reactions   Clarinex [desloratadine] Itching   Hydrocodone-acetaminophen Other (See Comments)   Hallucinations,  Makes her hear things   Latex Swelling   Tessalon [benzonatate] Nausea Only   Sulfa Antibiotics Rash   Sulfamethoxazole Rash      Medication List    TAKE these medications   ADULT GUMMY PO Take 2 each by mouth daily.   ALPRAZolam 0.25 MG tablet Commonly known as:  XANAX Take before coming to appt for biopsy   amLODipine 5 MG tablet Commonly known as:  NORVASC Take 5 mg by mouth daily.   diphenhydrAMINE 25 MG tablet Commonly known as:  BENADRYL Take 25 mg by mouth at bedtime as needed for allergies.   estradiol 0.1 mg/24hr patch Commonly known as:  CLIMARA - Dosed in mg/24 hr Place 1 patch (0.1 mg  total) once a week onto the skin.   fexofenadine 180 MG tablet Commonly known as:  ALLEGRA Take 180 mg by mouth daily as needed for allergies or rhinitis.   fluconazole 150 MG tablet Commonly known as:  DIFLUCAN Take 1 tablet by mouth once and if symptoms persist, take one tablet 72 hours later.   hydrochlorothiazide 12.5 MG capsule Commonly known as:  MICROZIDE Take 12.5 mg by mouth daily as needed (edema).   ibuprofen 200 MG tablet Commonly known as:  ADVIL,MOTRIN Take 400-600 mg by mouth every 6 (six) hours as needed for moderate pain (Takes 600 mg as needed for back pain/menstrual cramps).   metroNIDAZOLE 500 MG tablet Commonly known as:  FLAGYL Take 1 tablet (500 mg total) by mouth 2 (two) times daily.   misoprostol 200 MCG tablet Commonly known as:  CYTOTEC Take 3 pills by mouth the night before biopsy.   oxyCODONE-acetaminophen 5-325 MG tablet Commonly known as:  PERCOCET/ROXICET Take 1-2 tablets every 6 (six) hours as needed by mouth.   polyethylene glycol packet Commonly known as:  MIRALAX / GLYCOLAX Take 17 g by mouth daily as needed for mild constipation.   ranitidine 150 MG tablet Commonly known as:  ZANTAC Take 150 mg by mouth daily as needed for heartburn.   temazepam 30 MG capsule Commonly known as:  RESTORIL Take 30 mg by mouth at bedtime as needed for sleep.   valACYclovir 500 MG tablet Commonly known as:  VALTREX Take 1,000 mg by mouth See admin instructions. 2 tablets for 3 days as needed for outbreaks  Vitamin D 2000 units Caps Take 2,000 Units by mouth daily.      Follow-up Information    Emily Filbert, MD. Schedule an appointment as soon as possible for a visit in 1 month.   Specialty:  Obstetrics and Gynecology Contact information: [place] [address] [phone]           Signed: Emily Filbert 11/02/2017, 12:46 PM

## 2017-11-12 ENCOUNTER — Telehealth: Payer: Self-pay | Admitting: General Practice

## 2017-11-12 ENCOUNTER — Telehealth: Payer: Self-pay | Admitting: Radiology

## 2017-11-12 NOTE — Telephone Encounter (Signed)
patient called stating that she missed a call from Dr Hulan Fray. I explained that Dr Hulan Fray was not at our office today, she states that she has been running a fever and had recently had surgery. I asked her what her temp was and she was uncertain, had no way to take her temperature. I explained that she may want to be seen If she thinks that she is running fever and she stated that she doesn't have a ride. She expressed that she had have several people over to her house and maybe one of them didn't tell her that they were sick. She states that she will try to contact Dr Hulan Fray at the [facility], phone number provided to patient.

## 2017-11-12 NOTE — Telephone Encounter (Signed)
Patient called and left message on nurse line stating she had a hysterectomy on 11/15 and thinks she is running a fever. Patient is requesting a call back. Per chart review, patient has already spoken to someone at [facility] office. Called patient and she states she is also returning a phone call from Dr Hulan Fray. Told patient I will check with Dr Hulan Fray and call her back. Per Dr Hulan Fray, patient's pathology was normal. Called patient back and informed her. Asked patient what her temperature was and she states she doesn't know she doesn't have a thermometer but has been running a fever all day due to chills. Told patient she needs a way to check her temperature and if she is actually running a fever she should go to [facility] for evaluation. Patient verbalized understanding & had no questions

## 2017-12-05 ENCOUNTER — Ambulatory Visit: Payer: BLUE CROSS/BLUE SHIELD | Admitting: Obstetrics & Gynecology

## 2017-12-05 VITALS — BP 147/89 | HR 72 | Wt 142.0 lb

## 2017-12-05 DIAGNOSIS — Z9889 Other specified postprocedural states: Secondary | ICD-10-CM

## 2017-12-05 NOTE — Progress Notes (Signed)
   Subjective:    Patient ID: Mariah Nelson, female    DOB: 11-13-1966, 51 y.o.   MRN: [id number]  HPI 51 yo lady here for a post op visit. She had a TAH/BSO/cystoscopy 11/01/17. She has had no post op problems. She only took IBU and tylenol prn. She has not had sex since surgery.   Review of Systems     Objective:   Physical Exam Breathing, conversing, and ambulating normally Abd- benign Incision- healed great Cuff- healed great, normal bimanual exam     Assessment & Plan:  Post op state- doing well RTC 1 year/prn sooner

## 2018-02-11 ENCOUNTER — Telehealth: Payer: Self-pay | Admitting: Obstetrics & Gynecology

## 2018-02-11 NOTE — Telephone Encounter (Signed)
-----   Message from Cindie Crumbly, [place] sent at 02/11/2018  9:56 AM EST ----- Regarding: FW: request for change for rx Contact: [phone]   ----- Message ----- From: Blanchie Dessert, [place] Sent: 02/11/2018   8:54 AM To: [place] [facility] Subject: request for change for rx                      Patient is requesting a different type of patch, she is having issues with this particular patch type. Pt requesting call

## 2018-02-14 MED ORDER — ESTRADIOL 0.1 MG/24HR TD PTTW: 1.0000 | MEDICATED_PATCH | TRANSDERMAL | 12 refills | Status: AC

## 2018-02-14 NOTE — Telephone Encounter (Signed)
Pt requested [place] patch - sent to pharmacy per dr dove

## 2018-02-14 NOTE — Addendum Note (Signed)
Addended by: Gretchen Short on: 02/14/2018 11:36 AM   Modules accepted: Orders
